# Patient Record
Sex: Female | Born: 1940
Health system: Southern US, Community
[De-identification: ages and names within clinical notes are randomized; demographics above are authoritative.]

## PROBLEM LIST (undated history)

## (undated) DIAGNOSIS — G5793 Unspecified mononeuropathy of bilateral lower limbs: Secondary | ICD-10-CM

## (undated) DIAGNOSIS — F419 Anxiety disorder, unspecified: Secondary | ICD-10-CM

## (undated) HISTORY — PX: BUNIONECTOMY: SHX129

## (undated) HISTORY — PX: BREAST EXCISIONAL BIOPSY: SUR124

## (undated) HISTORY — PX: BREAST LUMPECTOMY: SHX2

## (undated) HISTORY — DX: Unspecified mononeuropathy of bilateral lower limbs: G57.93

## (undated) HISTORY — DX: Anxiety disorder, unspecified: F41.9

---

## 1970-11-09 HISTORY — PX: ABDOMINAL HYSTERECTOMY: SUR658

## 1993-11-09 HISTORY — PX: OOPHORECTOMY: SHX86

## 1998-11-09 HISTORY — PX: GALLBLADDER SURGERY: SHX652

## 2001-10-09 HISTORY — PX: CATARACT EXTRACTION: SUR2

## 2001-11-09 HISTORY — PX: CATARACT EXTRACTION: SUR2

## 2005-06-09 HISTORY — PX: TOTAL HIP ARTHROPLASTY: SHX124

## 2007-12-11 HISTORY — PX: ANKLE RECONSTRUCTION: SHX1151

## 2008-11-09 HISTORY — PX: TOE SURGERY: SHX1073

## 2008-11-13 HISTORY — PX: TOTAL HIP ARTHROPLASTY: SHX124

## 2011-03-04 HISTORY — PX: OTHER SURGICAL HISTORY: SHX169

## 2011-04-22 HISTORY — PX: OTHER SURGICAL HISTORY: SHX169

## 2011-12-08 HISTORY — PX: REPLACEMENT TOTAL KNEE: SUR1224

## 2014-07-18 HISTORY — PX: LIPOMA EXCISION: SHX5283

## 2015-03-10 DIAGNOSIS — G629 Polyneuropathy, unspecified: Secondary | ICD-10-CM | POA: Insufficient documentation

## 2015-11-12 DIAGNOSIS — G629 Polyneuropathy, unspecified: Secondary | ICD-10-CM | POA: Diagnosis not present

## 2016-01-14 DIAGNOSIS — E538 Deficiency of other specified B group vitamins: Secondary | ICD-10-CM | POA: Diagnosis not present

## 2016-01-14 DIAGNOSIS — I10 Essential (primary) hypertension: Secondary | ICD-10-CM | POA: Diagnosis not present

## 2016-01-14 DIAGNOSIS — M79604 Pain in right leg: Secondary | ICD-10-CM | POA: Diagnosis not present

## 2016-01-14 DIAGNOSIS — F411 Generalized anxiety disorder: Secondary | ICD-10-CM | POA: Diagnosis not present

## 2016-01-14 DIAGNOSIS — R0989 Other specified symptoms and signs involving the circulatory and respiratory systems: Secondary | ICD-10-CM | POA: Diagnosis not present

## 2016-01-14 DIAGNOSIS — G6289 Other specified polyneuropathies: Secondary | ICD-10-CM | POA: Diagnosis not present

## 2016-01-14 DIAGNOSIS — E7800 Pure hypercholesterolemia, unspecified: Secondary | ICD-10-CM | POA: Diagnosis not present

## 2016-01-14 DIAGNOSIS — G8929 Other chronic pain: Secondary | ICD-10-CM | POA: Diagnosis not present

## 2016-01-14 DIAGNOSIS — R7303 Prediabetes: Secondary | ICD-10-CM | POA: Diagnosis not present

## 2016-01-14 DIAGNOSIS — M79605 Pain in left leg: Secondary | ICD-10-CM | POA: Diagnosis not present

## 2016-01-15 DIAGNOSIS — R7303 Prediabetes: Secondary | ICD-10-CM | POA: Diagnosis not present

## 2016-01-15 DIAGNOSIS — I10 Essential (primary) hypertension: Secondary | ICD-10-CM | POA: Diagnosis not present

## 2016-01-15 DIAGNOSIS — E7800 Pure hypercholesterolemia, unspecified: Secondary | ICD-10-CM | POA: Diagnosis not present

## 2016-01-15 DIAGNOSIS — E538 Deficiency of other specified B group vitamins: Secondary | ICD-10-CM | POA: Diagnosis not present

## 2016-01-24 DIAGNOSIS — R300 Dysuria: Secondary | ICD-10-CM | POA: Diagnosis not present

## 2016-01-24 DIAGNOSIS — I6523 Occlusion and stenosis of bilateral carotid arteries: Secondary | ICD-10-CM | POA: Diagnosis not present

## 2016-01-24 DIAGNOSIS — R0989 Other specified symptoms and signs involving the circulatory and respiratory systems: Secondary | ICD-10-CM | POA: Diagnosis not present

## 2016-02-04 DIAGNOSIS — M79609 Pain in unspecified limb: Secondary | ICD-10-CM | POA: Diagnosis not present

## 2016-02-04 DIAGNOSIS — E785 Hyperlipidemia, unspecified: Secondary | ICD-10-CM | POA: Diagnosis not present

## 2016-02-04 DIAGNOSIS — I70213 Atherosclerosis of native arteries of extremities with intermittent claudication, bilateral legs: Secondary | ICD-10-CM | POA: Diagnosis not present

## 2016-02-05 DIAGNOSIS — I70213 Atherosclerosis of native arteries of extremities with intermittent claudication, bilateral legs: Secondary | ICD-10-CM | POA: Diagnosis not present

## 2016-02-05 DIAGNOSIS — M79609 Pain in unspecified limb: Secondary | ICD-10-CM | POA: Diagnosis not present

## 2016-02-05 DIAGNOSIS — I739 Peripheral vascular disease, unspecified: Secondary | ICD-10-CM | POA: Diagnosis not present

## 2016-02-05 DIAGNOSIS — E785 Hyperlipidemia, unspecified: Secondary | ICD-10-CM | POA: Diagnosis not present

## 2016-04-03 DIAGNOSIS — Z96641 Presence of right artificial hip joint: Secondary | ICD-10-CM | POA: Diagnosis not present

## 2016-04-03 DIAGNOSIS — M47816 Spondylosis without myelopathy or radiculopathy, lumbar region: Secondary | ICD-10-CM | POA: Diagnosis not present

## 2016-04-03 DIAGNOSIS — Z96649 Presence of unspecified artificial hip joint: Secondary | ICD-10-CM | POA: Insufficient documentation

## 2016-04-03 DIAGNOSIS — M545 Low back pain: Secondary | ICD-10-CM | POA: Diagnosis not present

## 2016-04-03 DIAGNOSIS — M25551 Pain in right hip: Secondary | ICD-10-CM | POA: Diagnosis not present

## 2016-04-03 DIAGNOSIS — Z96642 Presence of left artificial hip joint: Secondary | ICD-10-CM | POA: Diagnosis not present

## 2016-04-03 DIAGNOSIS — M25552 Pain in left hip: Secondary | ICD-10-CM | POA: Diagnosis not present

## 2016-04-13 DIAGNOSIS — H04123 Dry eye syndrome of bilateral lacrimal glands: Secondary | ICD-10-CM | POA: Diagnosis not present

## 2016-04-14 DIAGNOSIS — M25552 Pain in left hip: Secondary | ICD-10-CM | POA: Diagnosis not present

## 2016-04-21 DIAGNOSIS — M25552 Pain in left hip: Secondary | ICD-10-CM | POA: Diagnosis not present

## 2016-04-22 DIAGNOSIS — I10 Essential (primary) hypertension: Secondary | ICD-10-CM | POA: Diagnosis not present

## 2016-04-22 DIAGNOSIS — R7303 Prediabetes: Secondary | ICD-10-CM | POA: Diagnosis not present

## 2016-04-22 DIAGNOSIS — E7800 Pure hypercholesterolemia, unspecified: Secondary | ICD-10-CM | POA: Diagnosis not present

## 2016-04-22 DIAGNOSIS — R232 Flushing: Secondary | ICD-10-CM | POA: Diagnosis not present

## 2016-04-22 DIAGNOSIS — N898 Other specified noninflammatory disorders of vagina: Secondary | ICD-10-CM | POA: Diagnosis not present

## 2016-04-22 DIAGNOSIS — F411 Generalized anxiety disorder: Secondary | ICD-10-CM | POA: Diagnosis not present

## 2016-04-23 DIAGNOSIS — M25552 Pain in left hip: Secondary | ICD-10-CM | POA: Diagnosis not present

## 2016-04-27 DIAGNOSIS — M25552 Pain in left hip: Secondary | ICD-10-CM | POA: Diagnosis not present

## 2016-04-30 DIAGNOSIS — M25552 Pain in left hip: Secondary | ICD-10-CM | POA: Diagnosis not present

## 2016-05-05 DIAGNOSIS — M25552 Pain in left hip: Secondary | ICD-10-CM | POA: Diagnosis not present

## 2016-05-07 DIAGNOSIS — M25552 Pain in left hip: Secondary | ICD-10-CM | POA: Diagnosis not present

## 2016-05-14 DIAGNOSIS — M25552 Pain in left hip: Secondary | ICD-10-CM | POA: Diagnosis not present

## 2016-05-19 DIAGNOSIS — M25552 Pain in left hip: Secondary | ICD-10-CM | POA: Diagnosis not present

## 2016-06-29 DIAGNOSIS — M25552 Pain in left hip: Secondary | ICD-10-CM | POA: Diagnosis not present

## 2016-08-04 DIAGNOSIS — M79604 Pain in right leg: Secondary | ICD-10-CM | POA: Diagnosis not present

## 2016-08-04 DIAGNOSIS — M79605 Pain in left leg: Secondary | ICD-10-CM | POA: Diagnosis not present

## 2016-08-04 DIAGNOSIS — R600 Localized edema: Secondary | ICD-10-CM | POA: Diagnosis not present

## 2016-08-04 DIAGNOSIS — G2581 Restless legs syndrome: Secondary | ICD-10-CM | POA: Diagnosis not present

## 2016-08-07 ENCOUNTER — Other Ambulatory Visit: Payer: Self-pay | Admitting: Family Medicine

## 2016-08-07 DIAGNOSIS — Z1231 Encounter for screening mammogram for malignant neoplasm of breast: Secondary | ICD-10-CM

## 2016-08-07 DIAGNOSIS — M545 Low back pain: Secondary | ICD-10-CM | POA: Diagnosis not present

## 2016-08-12 DIAGNOSIS — G2581 Restless legs syndrome: Secondary | ICD-10-CM | POA: Diagnosis not present

## 2016-08-12 DIAGNOSIS — M79605 Pain in left leg: Secondary | ICD-10-CM | POA: Diagnosis not present

## 2016-08-12 DIAGNOSIS — M79604 Pain in right leg: Secondary | ICD-10-CM | POA: Diagnosis not present

## 2016-08-12 DIAGNOSIS — R600 Localized edema: Secondary | ICD-10-CM | POA: Diagnosis not present

## 2016-08-17 DIAGNOSIS — F418 Other specified anxiety disorders: Secondary | ICD-10-CM | POA: Diagnosis not present

## 2016-08-24 DIAGNOSIS — M79605 Pain in left leg: Secondary | ICD-10-CM | POA: Diagnosis not present

## 2016-08-24 DIAGNOSIS — M79604 Pain in right leg: Secondary | ICD-10-CM | POA: Diagnosis not present

## 2016-08-25 ENCOUNTER — Ambulatory Visit
Admission: RE | Admit: 2016-08-25 | Discharge: 2016-08-25 | Disposition: A | Discharge status: Home / Self Care | Payer: Medicare Other | Source: Ambulatory Visit | Attending: Family Medicine | Admitting: Family Medicine

## 2016-08-25 DIAGNOSIS — Z1231 Encounter for screening mammogram for malignant neoplasm of breast: Secondary | ICD-10-CM | POA: Diagnosis not present

## 2016-09-08 DIAGNOSIS — J3089 Other allergic rhinitis: Secondary | ICD-10-CM | POA: Diagnosis not present

## 2016-09-08 DIAGNOSIS — G629 Polyneuropathy, unspecified: Secondary | ICD-10-CM | POA: Diagnosis not present

## 2016-09-08 DIAGNOSIS — E784 Other hyperlipidemia: Secondary | ICD-10-CM | POA: Diagnosis not present

## 2016-09-08 DIAGNOSIS — M79605 Pain in left leg: Secondary | ICD-10-CM | POA: Diagnosis not present

## 2016-09-08 DIAGNOSIS — R911 Solitary pulmonary nodule: Secondary | ICD-10-CM | POA: Diagnosis not present

## 2016-09-08 DIAGNOSIS — Z79899 Other long term (current) drug therapy: Secondary | ICD-10-CM | POA: Diagnosis not present

## 2016-09-08 DIAGNOSIS — F419 Anxiety disorder, unspecified: Secondary | ICD-10-CM | POA: Diagnosis not present

## 2016-09-08 DIAGNOSIS — M79604 Pain in right leg: Secondary | ICD-10-CM | POA: Diagnosis not present

## 2016-09-08 DIAGNOSIS — I10 Essential (primary) hypertension: Secondary | ICD-10-CM | POA: Diagnosis not present

## 2016-09-25 DIAGNOSIS — M79605 Pain in left leg: Secondary | ICD-10-CM | POA: Diagnosis not present

## 2016-09-25 DIAGNOSIS — E784 Other hyperlipidemia: Secondary | ICD-10-CM | POA: Diagnosis not present

## 2016-09-25 DIAGNOSIS — M79604 Pain in right leg: Secondary | ICD-10-CM | POA: Diagnosis not present

## 2016-10-08 ENCOUNTER — Encounter: Payer: Self-pay | Admitting: *Deleted

## 2016-10-09 ENCOUNTER — Encounter: Payer: Self-pay | Admitting: Pulmonary Disease

## 2016-10-09 ENCOUNTER — Ambulatory Visit (INDEPENDENT_AMBULATORY_CARE_PROVIDER_SITE_OTHER): Payer: Medicare Other | Admitting: Pulmonary Disease

## 2016-10-09 VITALS — BP 132/66 | HR 74 | Ht 65.0 in | Wt 171.4 lb

## 2016-10-09 DIAGNOSIS — R911 Solitary pulmonary nodule: Secondary | ICD-10-CM | POA: Diagnosis not present

## 2016-10-09 NOTE — Patient Instructions (Addendum)
Stop taking the Spiriva. We'll start you on Bevespi inhaler. We will give you an albuterol rescue inhaler We'll schedule you for a CT of the chest without contrast in April 2018. Return to clinic after CT scan with PFTs.

## 2016-10-09 NOTE — Progress Notes (Signed)
Deborah Waller    RZ:[id number]    Dec 21, 1940  Primary Care Physician:Jennifer Owens Shark, NP  Referring Physician: Eloise Levels, NP [phone], [place] [address]  Chief complaint:  Consult to establish pulmonary care.  HPI: [name] is a 75 year old with past medical history of pulmonary nodules, mild COPD. She was previously followed by Dr. Barnet Pall, pulmonologist at [place], [place]. She moved here earlier this year to be closer to family. She is being followed for 3 subcentimeter pulmonary nodules with calcification suggestive of granulomas. She also has mild obstruction on her PFTs (I don't have the full study to review] and is maintained on Spiriva. She reports that the Spiriva is not working as well anymore. She reports worsening symptoms of dyspnea during the summer months when she is is out doors for golfing. She also reports chronic dyspnea on exertion on a daily basis that has been gradually worsening. She was previously on albuterol rescue inhaler but does not using it anymore. She denies any cough, sputum production, dyspnea at rest, wheezing, fevers, chills.  She is a 33 pack year smoking history. Quit in 1989. She was evaluated by sleep study in 2016. This was negative as per her pulmonologist records.  Outpatient Encounter Prescriptions as of 10/09/2016  Medication Sig  . aspirin EC 81 MG tablet Take 81 mg by mouth daily.  . celecoxib (CELEBREX) 200 MG capsule Take 200 mg by mouth daily.  . cholecalciferol (VITAMIN D) 400 units TABS tablet Take 400 Units by mouth.  . escitalopram (LEXAPRO) 10 MG tablet Take 10 mg by mouth daily.  . hydrochlorothiazide (HYDRODIURIL) 25 MG tablet Take 25 mg by mouth daily.  . pregabalin (LYRICA) 150 MG capsule Take 150 mg by mouth 2 (two) times daily.  . rosuvastatin (CRESTOR) 5 MG tablet Take 5 mg by mouth daily.  . Tiotropium Bromide Monohydrate (SPIRIVA RESPIMAT) 1.25 MCG/ACT AERS Inhale into the lungs.  . vitamin B-12  (CYANOCOBALAMIN) 1000 MCG tablet Take 1,000 mcg by mouth daily.   No facility-administered encounter medications on file as of 10/09/2016.     Allergies as of 10/09/2016 - Review Complete 10/09/2016  Allergen Reaction Noted  . Oxycodone  10/09/2016    Past Medical History:  Diagnosis Date  . Anxiety   . Neuropathy of both feet     Past Surgical History:  Procedure Laterality Date  . ABDOMINAL HYSTERECTOMY  1972  . ANKLE RECONSTRUCTION Left 12/2007  . BREAST LUMPECTOMY Left   . BUNIONECTOMY Left 2002, 2003  . CATARACT EXTRACTION Left 11/2001  . CATARACT EXTRACTION Right 10/2001  . GALLBLADDER SURGERY  2000  . leg bypass- left  03/04/2011  . leg bypass-right   04/22/2011  . LIPOMA EXCISION  07/18/2014  . OOPHORECTOMY  1995  . REPLACEMENT TOTAL KNEE Right 12/08/2011  . TOE SURGERY Left 2010  . TOTAL HIP ARTHROPLASTY Right 11/13/2008  . TOTAL HIP ARTHROPLASTY Left 06/2005    Family History  Problem Relation Age of Onset  . Allergies Sister     Social History   Social History  . Marital status: Married    Spouse name: N/A  . Number of children: N/A  . Years of education: N/A   Occupational History  . Not on file.   Social History Main Topics  . Smoking status: Former Smoker    Packs/day: 1.00    Years: 33.00    Types: Cigarettes    Quit date: 11/10/1987  . Smokeless tobacco:  Never Used  . Alcohol use No  . Drug use: No  . Sexual activity: Not on file   Other Topics Concern  . Not on file   Social History Narrative   Married, lives with husband, has one child, and is retired.      Review of systems: Review of Systems  Constitutional: Negative for fever and chills.  HENT: Negative.   Eyes: Negative for blurred vision.  Respiratory: as per HPI  Cardiovascular: Negative for chest pain and palpitations.  Gastrointestinal: Negative for vomiting, diarrhea, blood per rectum. Genitourinary: Negative for dysuria, urgency, frequency and hematuria.    Musculoskeletal: Negative for myalgias, back pain and joint pain.  Skin: Negative for itching and rash.  Neurological: Negative for dizziness, tremors, focal weakness, seizures and loss of consciousness.  Endo/Heme/Allergies: Negative for environmental allergies.  Psychiatric/Behavioral: Negative for depression, suicidal ideas and hallucinations.  All other systems reviewed and are negative.   Physical Exam: Blood pressure 132/66, pulse 74, height 5\' 5"  (1.651 m), weight 171 lb 6.4 oz (77.7 kg), SpO2 94 %. Gen:      No acute distress HEENT:  EOMI, sclera anicteric Neck:     No masses; no thyromegaly Lungs:    Clear to auscultation bilaterally; normal respiratory effort CV:         Regular rate and rhythm; no murmurs Abd:      + bowel sounds; soft, non-tender; no palpable masses, no distension Ext:    No edema; adequate peripheral perfusion Skin:      Warm and dry; no rash Neuro: alert and oriented x 3 Psych: normal mood and affect  Data Reviewed: Data from previous pulmonologist reviewed including images (except CT scan from 2015)  CT scan  07/30/14-. Small lung nodules CT scan 02/07/15- Small pulmonary nodule stable. CT scan 07/18/15-3 to 4 mm pulmonary nodule in the right upper lobe. 2-3 mm calcified nodule in the right lower lobe. 2-3 mm calcified granuloma in the left lower lobe. Subsegmental atelectasis in the right lower lobe. Diffuse emphysematous changes.  PFT 2016 -mild airflow obstruction, moderate diffusion abnormality as per pulmonologist note. Actual study not sent.  Assessment:  Mild COPD Worsening symptoms on Spiriva. I'll schedule for a repeat pulmonary function tests. We'll stop Spiriva and start a LABA/LAMA combination with Bevespi. She'll also be started on albuterol when necessary to be used in the summer months.  Pulmonary nodules Stable from 2015- 2016 with appearance suggestive of calcified granulomas. These are probably benign in nature. I'll repeat a CT of  the chest in around March 2018. This will coincide with two-year follow-up from March 2016 scan which is the earliest scan I have images to compare with. If stable we can stop following up with further CT scans.  Plan/Recommendations: - Stop Spiriva - Start Bevespi, Albuterol PRN - CT scan follow up  Marshell Garfinkel MD [facility] Pager [phone] 10/09/2016, 11:25 AM  CC: Deborah Levels, NP

## 2016-10-13 MED ORDER — GLYCOPYRROLATE-FORMOTEROL 9-4.8 MCG/ACT IN AERO: 2.0000 | INHALATION_SPRAY | Freq: Two times a day (BID) | RESPIRATORY_TRACT | 0 refills | Status: DC

## 2016-10-14 ENCOUNTER — Other Ambulatory Visit: Payer: Self-pay

## 2016-10-14 ENCOUNTER — Telehealth: Payer: Self-pay | Admitting: Pulmonary Disease

## 2016-10-14 MED ORDER — GLYCOPYRROLATE-FORMOTEROL 9-4.8 MCG/ACT IN AERO: 2.0000 | INHALATION_SPRAY | Freq: Two times a day (BID) | RESPIRATORY_TRACT | 1 refills | Status: DC

## 2016-10-14 MED ORDER — ALBUTEROL SULFATE HFA 108 (90 BASE) MCG/ACT IN AERS: 2.0000 | INHALATION_SPRAY | Freq: Four times a day (QID) | RESPIRATORY_TRACT | 1 refills | Status: DC | PRN

## 2016-10-14 NOTE — Telephone Encounter (Signed)
Patient returning phone call. Please call patient [phone]

## 2016-10-14 NOTE — Telephone Encounter (Signed)
[id number] x1 

## 2016-10-14 NOTE — Telephone Encounter (Signed)
Plan/Recommendations: - Stop Spiriva - Start Bevespi, Albuterol PRN - CT scan follow up  Marshell Garfinkel MD [facility] Pager [phone] 10/09/2016, 11:25 AM  Pt states was told Bevespi and Albuterol HFA would be sent at [place] for 90 day supply to [facility]-she contacted [facility] and was told they do not have any Rx's on file for her.    Pt is aware that I have sent Rx's and apologized for any inconvenience this may have caused.

## 2016-10-21 DIAGNOSIS — M7062 Trochanteric bursitis, left hip: Secondary | ICD-10-CM | POA: Diagnosis not present

## 2016-10-26 DIAGNOSIS — M25552 Pain in left hip: Secondary | ICD-10-CM | POA: Diagnosis not present

## 2016-10-26 DIAGNOSIS — M7632 Iliotibial band syndrome, left leg: Secondary | ICD-10-CM | POA: Diagnosis not present

## 2016-10-26 DIAGNOSIS — M25652 Stiffness of left hip, not elsewhere classified: Secondary | ICD-10-CM | POA: Diagnosis not present

## 2016-11-04 DIAGNOSIS — M25652 Stiffness of left hip, not elsewhere classified: Secondary | ICD-10-CM | POA: Diagnosis not present

## 2016-11-04 DIAGNOSIS — M25552 Pain in left hip: Secondary | ICD-10-CM | POA: Diagnosis not present

## 2016-11-04 DIAGNOSIS — M7632 Iliotibial band syndrome, left leg: Secondary | ICD-10-CM | POA: Diagnosis not present

## 2016-11-05 DIAGNOSIS — G5761 Lesion of plantar nerve, right lower limb: Secondary | ICD-10-CM | POA: Diagnosis not present

## 2016-11-05 DIAGNOSIS — M7751 Other enthesopathy of right foot: Secondary | ICD-10-CM | POA: Diagnosis not present

## 2016-11-05 DIAGNOSIS — M19071 Primary osteoarthritis, right ankle and foot: Secondary | ICD-10-CM | POA: Diagnosis not present

## 2016-11-05 DIAGNOSIS — G5791 Unspecified mononeuropathy of right lower limb: Secondary | ICD-10-CM | POA: Diagnosis not present

## 2016-11-12 DIAGNOSIS — G5761 Lesion of plantar nerve, right lower limb: Secondary | ICD-10-CM | POA: Diagnosis not present

## 2016-11-12 DIAGNOSIS — M19072 Primary osteoarthritis, left ankle and foot: Secondary | ICD-10-CM | POA: Diagnosis not present

## 2016-11-12 DIAGNOSIS — M659 Synovitis and tenosynovitis, unspecified: Secondary | ICD-10-CM | POA: Diagnosis not present

## 2016-11-17 DIAGNOSIS — M25652 Stiffness of left hip, not elsewhere classified: Secondary | ICD-10-CM | POA: Diagnosis not present

## 2016-11-17 DIAGNOSIS — M25552 Pain in left hip: Secondary | ICD-10-CM | POA: Diagnosis not present

## 2016-11-17 DIAGNOSIS — M7632 Iliotibial band syndrome, left leg: Secondary | ICD-10-CM | POA: Diagnosis not present

## 2016-11-20 DIAGNOSIS — M25652 Stiffness of left hip, not elsewhere classified: Secondary | ICD-10-CM | POA: Diagnosis not present

## 2016-11-20 DIAGNOSIS — M7632 Iliotibial band syndrome, left leg: Secondary | ICD-10-CM | POA: Diagnosis not present

## 2016-11-20 DIAGNOSIS — M25552 Pain in left hip: Secondary | ICD-10-CM | POA: Diagnosis not present

## 2016-11-23 DIAGNOSIS — G5761 Lesion of plantar nerve, right lower limb: Secondary | ICD-10-CM | POA: Diagnosis not present

## 2016-11-23 DIAGNOSIS — G5762 Lesion of plantar nerve, left lower limb: Secondary | ICD-10-CM | POA: Diagnosis not present

## 2016-11-23 DIAGNOSIS — M659 Synovitis and tenosynovitis, unspecified: Secondary | ICD-10-CM | POA: Diagnosis not present

## 2016-11-23 DIAGNOSIS — M7751 Other enthesopathy of right foot: Secondary | ICD-10-CM | POA: Diagnosis not present

## 2016-11-23 DIAGNOSIS — M7752 Other enthesopathy of left foot: Secondary | ICD-10-CM | POA: Diagnosis not present

## 2016-11-24 DIAGNOSIS — M25652 Stiffness of left hip, not elsewhere classified: Secondary | ICD-10-CM | POA: Diagnosis not present

## 2016-11-24 DIAGNOSIS — M25552 Pain in left hip: Secondary | ICD-10-CM | POA: Diagnosis not present

## 2016-11-24 DIAGNOSIS — M7632 Iliotibial band syndrome, left leg: Secondary | ICD-10-CM | POA: Diagnosis not present

## 2016-11-30 DIAGNOSIS — G5761 Lesion of plantar nerve, right lower limb: Secondary | ICD-10-CM | POA: Diagnosis not present

## 2016-11-30 DIAGNOSIS — M7752 Other enthesopathy of left foot: Secondary | ICD-10-CM | POA: Diagnosis not present

## 2016-11-30 DIAGNOSIS — G5762 Lesion of plantar nerve, left lower limb: Secondary | ICD-10-CM | POA: Diagnosis not present

## 2016-11-30 DIAGNOSIS — M7751 Other enthesopathy of right foot: Secondary | ICD-10-CM | POA: Diagnosis not present

## 2016-11-30 DIAGNOSIS — T148XXA Other injury of unspecified body region, initial encounter: Secondary | ICD-10-CM | POA: Diagnosis not present

## 2016-12-02 DIAGNOSIS — M7632 Iliotibial band syndrome, left leg: Secondary | ICD-10-CM | POA: Diagnosis not present

## 2016-12-02 DIAGNOSIS — M25552 Pain in left hip: Secondary | ICD-10-CM | POA: Diagnosis not present

## 2016-12-02 DIAGNOSIS — M25652 Stiffness of left hip, not elsewhere classified: Secondary | ICD-10-CM | POA: Diagnosis not present

## 2016-12-03 DIAGNOSIS — M25652 Stiffness of left hip, not elsewhere classified: Secondary | ICD-10-CM | POA: Diagnosis not present

## 2016-12-03 DIAGNOSIS — M7632 Iliotibial band syndrome, left leg: Secondary | ICD-10-CM | POA: Diagnosis not present

## 2016-12-03 DIAGNOSIS — M25552 Pain in left hip: Secondary | ICD-10-CM | POA: Diagnosis not present

## 2016-12-07 DIAGNOSIS — M7752 Other enthesopathy of left foot: Secondary | ICD-10-CM | POA: Diagnosis not present

## 2016-12-07 DIAGNOSIS — T148XXA Other injury of unspecified body region, initial encounter: Secondary | ICD-10-CM | POA: Diagnosis not present

## 2016-12-07 DIAGNOSIS — G5762 Lesion of plantar nerve, left lower limb: Secondary | ICD-10-CM | POA: Diagnosis not present

## 2016-12-07 DIAGNOSIS — M7751 Other enthesopathy of right foot: Secondary | ICD-10-CM | POA: Diagnosis not present

## 2016-12-07 DIAGNOSIS — G5761 Lesion of plantar nerve, right lower limb: Secondary | ICD-10-CM | POA: Diagnosis not present

## 2016-12-08 DIAGNOSIS — M7632 Iliotibial band syndrome, left leg: Secondary | ICD-10-CM | POA: Diagnosis not present

## 2016-12-08 DIAGNOSIS — M25552 Pain in left hip: Secondary | ICD-10-CM | POA: Diagnosis not present

## 2016-12-08 DIAGNOSIS — M25652 Stiffness of left hip, not elsewhere classified: Secondary | ICD-10-CM | POA: Diagnosis not present

## 2016-12-10 DIAGNOSIS — M25652 Stiffness of left hip, not elsewhere classified: Secondary | ICD-10-CM | POA: Diagnosis not present

## 2016-12-10 DIAGNOSIS — M7632 Iliotibial band syndrome, left leg: Secondary | ICD-10-CM | POA: Diagnosis not present

## 2016-12-10 DIAGNOSIS — M25552 Pain in left hip: Secondary | ICD-10-CM | POA: Diagnosis not present

## 2016-12-16 DIAGNOSIS — M25552 Pain in left hip: Secondary | ICD-10-CM | POA: Diagnosis not present

## 2016-12-16 DIAGNOSIS — M7632 Iliotibial band syndrome, left leg: Secondary | ICD-10-CM | POA: Diagnosis not present

## 2016-12-16 DIAGNOSIS — M25652 Stiffness of left hip, not elsewhere classified: Secondary | ICD-10-CM | POA: Diagnosis not present

## 2016-12-31 DIAGNOSIS — G5762 Lesion of plantar nerve, left lower limb: Secondary | ICD-10-CM | POA: Diagnosis not present

## 2016-12-31 DIAGNOSIS — M7751 Other enthesopathy of right foot: Secondary | ICD-10-CM | POA: Diagnosis not present

## 2016-12-31 DIAGNOSIS — G5761 Lesion of plantar nerve, right lower limb: Secondary | ICD-10-CM | POA: Diagnosis not present

## 2016-12-31 DIAGNOSIS — M7752 Other enthesopathy of left foot: Secondary | ICD-10-CM | POA: Diagnosis not present

## 2017-01-04 DIAGNOSIS — E538 Deficiency of other specified B group vitamins: Secondary | ICD-10-CM | POA: Diagnosis not present

## 2017-01-04 DIAGNOSIS — I739 Peripheral vascular disease, unspecified: Secondary | ICD-10-CM | POA: Diagnosis not present

## 2017-01-04 DIAGNOSIS — E784 Other hyperlipidemia: Secondary | ICD-10-CM | POA: Diagnosis not present

## 2017-01-04 DIAGNOSIS — Z79899 Other long term (current) drug therapy: Secondary | ICD-10-CM | POA: Diagnosis not present

## 2017-01-04 DIAGNOSIS — R7303 Prediabetes: Secondary | ICD-10-CM | POA: Diagnosis not present

## 2017-01-04 DIAGNOSIS — F419 Anxiety disorder, unspecified: Secondary | ICD-10-CM | POA: Diagnosis not present

## 2017-01-04 DIAGNOSIS — G629 Polyneuropathy, unspecified: Secondary | ICD-10-CM | POA: Diagnosis not present

## 2017-01-04 DIAGNOSIS — I10 Essential (primary) hypertension: Secondary | ICD-10-CM | POA: Diagnosis not present

## 2017-01-04 DIAGNOSIS — M8580 Other specified disorders of bone density and structure, unspecified site: Secondary | ICD-10-CM | POA: Diagnosis not present

## 2017-01-07 DIAGNOSIS — G5761 Lesion of plantar nerve, right lower limb: Secondary | ICD-10-CM | POA: Diagnosis not present

## 2017-01-07 DIAGNOSIS — M19072 Primary osteoarthritis, left ankle and foot: Secondary | ICD-10-CM | POA: Diagnosis not present

## 2017-01-07 DIAGNOSIS — M7751 Other enthesopathy of right foot: Secondary | ICD-10-CM | POA: Diagnosis not present

## 2017-01-07 DIAGNOSIS — M7752 Other enthesopathy of left foot: Secondary | ICD-10-CM | POA: Diagnosis not present

## 2017-01-07 DIAGNOSIS — G5762 Lesion of plantar nerve, left lower limb: Secondary | ICD-10-CM | POA: Diagnosis not present

## 2017-01-19 ENCOUNTER — Telehealth: Payer: Self-pay | Admitting: Pulmonary Disease

## 2017-01-19 NOTE — Telephone Encounter (Signed)
Spoke with pt, calling to see if she was due for a ct chest.  I advised pt that she is scheduled for one on 02/15/17 at 2:00 at [facility] CT.  Pt expressed understanding, took down address, date, and time.  Nothing further needed.

## 2017-02-04 DIAGNOSIS — M722 Plantar fascial fibromatosis: Secondary | ICD-10-CM | POA: Diagnosis not present

## 2017-02-04 DIAGNOSIS — L600 Ingrowing nail: Secondary | ICD-10-CM | POA: Diagnosis not present

## 2017-02-04 DIAGNOSIS — M7732 Calcaneal spur, left foot: Secondary | ICD-10-CM | POA: Diagnosis not present

## 2017-02-04 DIAGNOSIS — M71572 Other bursitis, not elsewhere classified, left ankle and foot: Secondary | ICD-10-CM | POA: Diagnosis not present

## 2017-02-08 ENCOUNTER — Other Ambulatory Visit (INDEPENDENT_AMBULATORY_CARE_PROVIDER_SITE_OTHER): Payer: Self-pay | Admitting: Vascular Surgery

## 2017-02-08 DIAGNOSIS — I739 Peripheral vascular disease, unspecified: Secondary | ICD-10-CM

## 2017-02-08 DIAGNOSIS — M71572 Other bursitis, not elsewhere classified, left ankle and foot: Secondary | ICD-10-CM | POA: Diagnosis not present

## 2017-02-08 DIAGNOSIS — Z95828 Presence of other vascular implants and grafts: Secondary | ICD-10-CM

## 2017-02-08 DIAGNOSIS — E119 Type 2 diabetes mellitus without complications: Secondary | ICD-10-CM | POA: Diagnosis not present

## 2017-02-08 DIAGNOSIS — M722 Plantar fascial fibromatosis: Secondary | ICD-10-CM | POA: Diagnosis not present

## 2017-02-08 DIAGNOSIS — Z7984 Long term (current) use of oral hypoglycemic drugs: Secondary | ICD-10-CM | POA: Diagnosis not present

## 2017-02-08 DIAGNOSIS — M659 Synovitis and tenosynovitis, unspecified: Secondary | ICD-10-CM | POA: Diagnosis not present

## 2017-02-08 DIAGNOSIS — L600 Ingrowing nail: Secondary | ICD-10-CM | POA: Diagnosis not present

## 2017-02-09 ENCOUNTER — Encounter (INDEPENDENT_AMBULATORY_CARE_PROVIDER_SITE_OTHER): Payer: Self-pay

## 2017-02-09 ENCOUNTER — Ambulatory Visit (INDEPENDENT_AMBULATORY_CARE_PROVIDER_SITE_OTHER): Payer: Medicare Other

## 2017-02-09 ENCOUNTER — Encounter (INDEPENDENT_AMBULATORY_CARE_PROVIDER_SITE_OTHER): Payer: Self-pay | Admitting: Vascular Surgery

## 2017-02-09 ENCOUNTER — Ambulatory Visit (INDEPENDENT_AMBULATORY_CARE_PROVIDER_SITE_OTHER): Payer: Medicare Other | Admitting: Vascular Surgery

## 2017-02-09 VITALS — BP 141/70 | HR 56 | Resp 16 | Wt 170.0 lb

## 2017-02-09 DIAGNOSIS — I739 Peripheral vascular disease, unspecified: Secondary | ICD-10-CM

## 2017-02-09 DIAGNOSIS — Z95828 Presence of other vascular implants and grafts: Secondary | ICD-10-CM | POA: Diagnosis not present

## 2017-02-09 DIAGNOSIS — E785 Hyperlipidemia, unspecified: Secondary | ICD-10-CM

## 2017-02-09 DIAGNOSIS — E119 Type 2 diabetes mellitus without complications: Secondary | ICD-10-CM | POA: Insufficient documentation

## 2017-02-09 DIAGNOSIS — E1151 Type 2 diabetes mellitus with diabetic peripheral angiopathy without gangrene: Secondary | ICD-10-CM | POA: Diagnosis not present

## 2017-02-09 LAB — VAS US LOWER EXTREMITY ARTERIAL DUPLEX
LEFT PERO DIST DIA: 0 cm/s
LEFT PERO DIST SYS: -84 cm/s
LEFT POPLITEAL DIST DYS VEL: 0 cm/s
Left ant tibial distal sys: 75 cm/s
Left popliteal dist sys PSV: 61 cm/s
RIGHT ANT DIST TIBAL DIA PSV: 0 cm/s
RIGHT ANT DIST TIBAL SYS PSV: 118 cm/s
RIGHT POPLITEAL DIST EDV: 0 cm/s
RIGHT POST TIB DIST DIA: 9 cm/s
RIGHT POST TIB DIST SYS: 104 cm/s
Right peroneal sys PSV: -55 cm/s
Right peroneal sys min: 0 m/s
Right popliteal dist sys PSV: 64 cm/s
left ant tibial distal dia: 0 cm/s
left post tibial dist dia: 0 cm/s
left post tibial dist sys: 48 cm/s

## 2017-02-09 NOTE — Assessment & Plan Note (Signed)
The patient is doing well without any current limb threatening symptoms. She had a recent surgery on her foot which should have ample blood flow to heal well based on her noninvasive studies today. These noninvasive studies showed normal triphasic waveforms and normal ABIs bilaterally. Her bypasses are patent bilaterally with only mildly elevated velocities proximal to the left femoral to popliteal bypass. This is stable from her previous study. Plan to recheck in 1 year with noninvasive studies unless worsening problems develop in the interim.

## 2017-02-09 NOTE — Assessment & Plan Note (Signed)
lipid control important in reducing the progression of atherosclerotic disease. Continue statin therapy  

## 2017-02-09 NOTE — Assessment & Plan Note (Signed)
blood glucose control important in reducing the progression of atherosclerotic disease. Also, involved in wound healing. On appropriate medications.  

## 2017-02-09 NOTE — Progress Notes (Signed)
MRN : [id number]  Deborah Waller is a 76 y.o. (1941/03/04) female who presents with chief complaint of  Chief Complaint  Patient presents with  . Re-evaluation    1 year ultrasound follow up  .  History of Present Illness: Patient returns today in follow up of PAD. She had a recent surgery on her foot which seems to be healing well. She has no new ischemic rest pain, ulceration, or infection. She follows up with noninvasive studies today. These noninvasive studies showed normal triphasic waveforms and normal ABIs bilaterally. Her bypasses are patent bilaterally with only mildly elevated velocities proximal to the left femoral to popliteal bypass. This is stable from her previous study.  Current Outpatient Prescriptions  Medication Sig Dispense Refill  . albuterol (PROVENTIL HFA;VENTOLIN HFA) 108 (90 Base) MCG/ACT inhaler Inhale 2 puffs into the lungs every 6 (six) hours as needed for wheezing or shortness of breath. 3 Inhaler 1  . aspirin EC 81 MG tablet Take 81 mg by mouth daily.    . cholecalciferol (VITAMIN D) 400 units TABS tablet Take 400 Units by mouth.    . escitalopram (LEXAPRO) 10 MG tablet Take 10 mg by mouth daily.    . Glycopyrrolate-Formoterol (BEVESPI AEROSPHERE) 9-4.8 MCG/ACT AERO Inhale 2 puffs into the lungs 2 (two) times daily. 3 Inhaler 1  . hydrocodone-ibuprofen (VICOPROFEN) 5-200 MG tablet Take 1 tablet by mouth every 8 (eight) hours as needed for pain.    . Ibuprofen-Famotidine 800-26.6 MG TABS Take by mouth 2 (two) times daily.    . pregabalin (LYRICA) 150 MG capsule Take 150 mg by mouth 2 (two) times daily.    . rosuvastatin (CRESTOR) 5 MG tablet Take 5 mg by mouth daily.    . vitamin B-12 (CYANOCOBALAMIN) 1000 MCG tablet Take 1,000 mcg by mouth daily.    . celecoxib (CELEBREX) 200 MG capsule Take 200 mg by mouth daily.    Marland Kitchen estradiol (ESTRACE) 0.1 MG/GM vaginal cream Place vaginally.    . hydrochlorothiazide (HYDRODIURIL) 25 MG tablet Take 25 mg by mouth daily.     Marland Kitchen LORazepam (ATIVAN) 2 MG tablet 1 tab 1 1/2 hours before the procedure    . metFORMIN (GLUCOPHAGE-XR) 500 MG 24 hr tablet     . simvastatin (ZOCOR) 20 MG tablet Take by mouth.    . Tiotropium Bromide Monohydrate (SPIRIVA RESPIMAT) 1.25 MCG/ACT AERS Inhale into the lungs.     No current facility-administered medications for this visit.     Past Medical History:  Diagnosis Date  . Anxiety   . Neuropathy of both feet     Past Surgical History:  Procedure Laterality Date  . ABDOMINAL HYSTERECTOMY  1972  . ANKLE RECONSTRUCTION Left 12/2007  . BREAST LUMPECTOMY Left   . BUNIONECTOMY Left 2002, 2003  . CATARACT EXTRACTION Left 11/2001  . CATARACT EXTRACTION Right 10/2001  . GALLBLADDER SURGERY  2000  . leg bypass- left  03/04/2011  . leg bypass-right   04/22/2011  . LIPOMA EXCISION  07/18/2014  . OOPHORECTOMY  1995  . REPLACEMENT TOTAL KNEE Right 12/08/2011  . TOE SURGERY Left 2010  . TOTAL HIP ARTHROPLASTY Right 11/13/2008  . TOTAL HIP ARTHROPLASTY Left 06/2005    Social History Social History  Substance Use Topics  . Smoking status: Former Smoker    Packs/day: 1.00    Years: 33.00    Types: Cigarettes    Quit date: 11/10/1987  . Smokeless tobacco: Never Used  . Alcohol use No  No IV  drug use  Family History Family History  Problem Relation Age of Onset  . Allergies Sister   No bleeding or clotting disorders  Allergies  Allergen Reactions  . Oxycodone     Childhood reaction, does not remember the reaction.      REVIEW OF SYSTEMS (Negative unless checked)  Constitutional: [] Weight loss  [] Fever  [] Chills Cardiac: [] Chest pain   [] Chest pressure   [] Palpitations   [] Shortness of breath when laying flat   [] Shortness of breath at rest   [] Shortness of breath with exertion. Vascular:  [x] Pain in legs with walking   [x] Pain in legs at rest   [] Pain in legs when laying flat   [] Claudication   [] Pain in feet when walking  [] Pain in feet at rest  [] Pain in feet when  laying flat   [] History of DVT   [] Phlebitis   [] Swelling in legs   [] Varicose veins   [] Non-healing ulcers Pulmonary:   [] Uses home oxygen   [] Productive cough   [] Hemoptysis   [] Wheeze  [] COPD   [] Asthma Neurologic:  [] Dizziness  [] Blackouts   [] Seizures   [] History of stroke   [] History of TIA  [] Aphasia   [] Temporary blindness   [] Dysphagia   [] Weakness or numbness in arms   [x] Weakness or numbness in legs Musculoskeletal:  [x] Arthritis   [] Joint swelling   [] Joint pain   [] Low back pain Hematologic:  [] Easy bruising  [] Easy bleeding   [] Hypercoagulable state   [] Anemic   Gastrointestinal:  [] Blood in stool   [] Vomiting blood  [] Gastroesophageal reflux/heartburn   [] Abdominal pain Genitourinary:  [] Chronic kidney disease   [] Difficult urination  [] Frequent urination  [] Burning with urination   [] Hematuria Skin:  [] Rashes   [] Ulcers   [] Wounds Psychological:  [] History of anxiety   []  History of major depression.  Physical Examination  BP (!) 141/70   Pulse (!) 56   Resp 16   Wt 170 lb (77.1 kg)   BMI 28.29 kg/m  Gen:  WD/WN, NAD Head: [place]/AT, No temporalis wasting. Ear/Nose/Throat: Hearing grossly intact, nares w/o erythema or drainage, trachea midline Eyes: Conjunctiva clear. Sclera non-icteric Neck: Supple.  No JVD.  Pulmonary:  Good air movement, no use of accessory muscles.  Cardiac: RRR, normal S1, S2 Vascular:  Vessel Right Left  Radial Palpable Palpable  Ulnar Palpable Palpable  Brachial Palpable Palpable  Carotid Palpable, without bruit Palpable, without bruit  Aorta Not palpable N/A  Femoral Palpable Palpable  Popliteal Palpable Palpable  PT Palpable Palpable  DP 1+ Palpable Palpable   Gastrointestinal: soft, non-tender/non-distended. No guarding/reflex.  Musculoskeletal: M/S 5/5 throughout.  No deformity or atrophy. No edema. Neurologic: Sensation grossly intact in extremities.  Symmetrical.  Speech is fluent.  Psychiatric: Judgment intact, Mood & affect  appropriate for pt's clinical situation. Dermatologic: No rashes or ulcers noted.  No cellulitis or open wounds. Lymph : No Cervical, Axillary, or Inguinal lymphadenopathy.      Labs Recent Results (from the past 2160 hour(s))  VAS [place] LOWER EXTREMITY ARTERIAL DUPLEX     Status: None (In process)   Collection Time: 02/09/17  9:01 AM  Result Value Ref Range   Left popliteal dist sys PSV 61 cm/s   Right peroneal sys min 0 m/s   Right popliteal dist sys PSV 64 cm/s   Right peroneal sys PSV -55 cm/s   Left ant tibial distal sys 75 cm/s   left ant tibial distal dia 0 cm/s   left post tibial dist sys 48 cm/s  left post tibial dist dia 0 cm/s   LEFT PERO DIST DIA 0 cm/s   LEFT PERO DIST SYS -84.00 cm/s   RIGHT ANT DIST TIBAL SYS PSV 118 cm/s   RIGHT ANT DIST TIBAL DIA PSV 0 cm/s   RIGHT POST TIB DIST SYS 104 cm/s   RIGHT POST TIB DIST DIA 9 cm/s   LEFT POPLITEAL DIST DYS VEL 0 cm/s   RIGHT POPLITEAL DIST EDV 0 cm/sec    Radiology No results found.    Assessment/Plan  Diabetes (HCC) blood glucose control important in reducing the progression of atherosclerotic disease. Also, involved in wound healing. On appropriate medications.   Hyperlipidemia lipid control important in reducing the progression of atherosclerotic disease. Continue statin therapy   PVD (peripheral vascular disease) ([place]) The patient is doing well without any current limb threatening symptoms. She had a recent surgery on her foot which should have ample blood flow to heal well based on her noninvasive studies today. These noninvasive studies showed normal triphasic waveforms and normal ABIs bilaterally. Her bypasses are patent bilaterally with only mildly elevated velocities proximal to the left femoral to popliteal bypass. This is stable from her previous study. Plan to recheck in 1 year with noninvasive studies unless worsening problems develop in the interim.    Leotis Pain, MD  02/09/2017 10:52  AM    This note was created with Dragon medical transcription system.  Any errors from dictation are purely unintentional

## 2017-02-15 ENCOUNTER — Ambulatory Visit (INDEPENDENT_AMBULATORY_CARE_PROVIDER_SITE_OTHER)
Admission: RE | Admit: 2017-02-15 | Discharge: 2017-02-15 | Disposition: A | Discharge status: Home / Self Care | Payer: Medicare Other | Source: Ambulatory Visit | Attending: Pulmonary Disease | Admitting: Pulmonary Disease

## 2017-02-15 DIAGNOSIS — R911 Solitary pulmonary nodule: Secondary | ICD-10-CM | POA: Diagnosis not present

## 2017-02-15 DIAGNOSIS — M205X2 Other deformities of toe(s) (acquired), left foot: Secondary | ICD-10-CM | POA: Diagnosis not present

## 2017-02-15 DIAGNOSIS — R918 Other nonspecific abnormal finding of lung field: Secondary | ICD-10-CM | POA: Diagnosis not present

## 2017-02-22 ENCOUNTER — Encounter: Payer: Self-pay | Admitting: Pulmonary Disease

## 2017-02-22 ENCOUNTER — Ambulatory Visit (INDEPENDENT_AMBULATORY_CARE_PROVIDER_SITE_OTHER): Payer: Medicare Other | Admitting: Pulmonary Disease

## 2017-02-22 VITALS — BP 130/72 | HR 87 | Ht 65.0 in | Wt 167.2 lb

## 2017-02-22 DIAGNOSIS — J438 Other emphysema: Secondary | ICD-10-CM | POA: Diagnosis not present

## 2017-02-22 DIAGNOSIS — R911 Solitary pulmonary nodule: Secondary | ICD-10-CM

## 2017-02-22 LAB — PULMONARY FUNCTION TEST
DL/VA % pred: 69 %
DL/VA: 3.4 ml/min/mmHg/L
DLCO cor % pred: 53 %
DLCO cor: 13.79 ml/min/mmHg
DLCO unc % pred: 54 %
DLCO unc: 14.08 ml/min/mmHg
FEF 25-75 Post: 2.47 L/sec
FEF 25-75 Pre: 1.39 L/sec
FEF2575-%Change-Post: 76 %
FEF2575-%Pred-Post: 144 %
FEF2575-%Pred-Pre: 81 %
FEV1-%Change-Post: 17 %
FEV1-%Pred-Post: 88 %
FEV1-%Pred-Pre: 75 %
FEV1-Post: 1.94 L
FEV1-Pre: 1.66 L
FEV1FVC-%Change-Post: -4 %
FEV1FVC-%Pred-Pre: 102 %
FEV6-%Change-Post: 22 %
FEV6-%Pred-Post: 93 %
FEV6-%Pred-Pre: 76 %
FEV6-Post: 2.62 L
FEV6-Pre: 2.13 L
FEV6FVC-%Change-Post: 0 %
FEV6FVC-%Pred-Post: 105 %
FEV6FVC-%Pred-Pre: 105 %
FVC-%Change-Post: 22 %
FVC-%Pred-Post: 89 %
FVC-%Pred-Pre: 73 %
FVC-Post: 2.62 L
FVC-Pre: 2.14 L
Post FEV1/FVC ratio: 74 %
Post FEV6/FVC ratio: 100 %
Pre FEV1/FVC ratio: 77 %
Pre FEV6/FVC Ratio: 100 %
RV % pred: 47 %
RV: 1.11 L
TLC % pred: 70 %
TLC: 3.66 L

## 2017-02-22 MED ORDER — BUDESONIDE-FORMOTEROL FUMARATE 80-4.5 MCG/ACT IN AERO: 2.0000 | INHALATION_SPRAY | Freq: Two times a day (BID) | RESPIRATORY_TRACT | 0 refills | Status: DC

## 2017-02-22 NOTE — Progress Notes (Addendum)
Deborah Waller    [id number]    04/27/1941  Primary Care Physician:Jennifer Owens Shark, NP  Referring Physician: Eloise Levels, NP [phone], [place] [address]  Chief complaint:   Follow-up for COPD   HPI: Deborah Waller is a 76 year old with past medical history of pulmonary nodules, mild COPD. She was previously followed by Dr. Barnet Pall, pulmonologist at [place], [place]. She moved here earlier this year to be closer to family. She is being followed for 3 subcentimeter pulmonary nodules with calcification suggestive of granulomas. She also has mild obstruction on her PFTs (I don't have the full study to review] and is maintained on Spiriva. She reports that the Spiriva is not working as well anymore. She reports worsening symptoms of dyspnea during the summer months when she is is out doors for golfing. She also reports chronic dyspnea on exertion on a daily basis that has been gradually worsening. She was previously on albuterol rescue inhaler but does not using it anymore. She denies any cough, sputum production, dyspnea at rest, wheezing, fevers, chills.  She is a 33 pack year smoking history. Quit in 1989. She was evaluated by sleep study in 2016. This was negative as per her pulmonologist records.  Interim history:  She states that her breathing is at baseline. She has dyspnea on exertion which is chronic and at baseline. She denies any cough, sputum production, wheezing, fevers, chills. Spiriva was changed to bevespi at last visit and she is tolerating this well.  Outpatient Encounter Prescriptions as of 02/22/2017  Medication Sig  . albuterol (PROVENTIL HFA;VENTOLIN HFA) 108 (90 Base) MCG/ACT inhaler Inhale 2 puffs into the lungs every 6 (six) hours as needed for wheezing or shortness of breath.  Marland Kitchen aspirin EC 81 MG tablet Take 81 mg by mouth daily.  . Cholecalciferol (VITAMIN D) 2000 units CAPS Take 2,000 Units by mouth daily.  Marland Kitchen escitalopram (LEXAPRO) 10 MG tablet  Take 10 mg by mouth daily.  . Glycopyrrolate-Formoterol (BEVESPI AEROSPHERE) 9-4.8 MCG/ACT AERO Inhale 2 puffs into the lungs 2 (two) times daily.  . hydrochlorothiazide (HYDRODIURIL) 25 MG tablet Take 25 mg by mouth daily.  . hydrocodone-ibuprofen (VICOPROFEN) 5-200 MG tablet Take 1 tablet by mouth every 8 (eight) hours as needed for pain.  . Ibuprofen-Famotidine (DUEXIS) 800-26.6 MG TABS Take by mouth 2 (two) times daily.  Marland Kitchen LORazepam (ATIVAN) 2 MG tablet 1 tab 1 1/2 hours before the procedure  . pregabalin (LYRICA) 150 MG capsule Take 150 mg by mouth 2 (two) times daily.  . rosuvastatin (CRESTOR) 5 MG tablet Take 5 mg by mouth daily.  . vitamin B-12 (CYANOCOBALAMIN) 1000 MCG tablet Take 1,000 mcg by mouth daily.  . [DISCONTINUED] celecoxib (CELEBREX) 200 MG capsule Take 200 mg by mouth daily.  . [DISCONTINUED] cholecalciferol (VITAMIN D) 400 units TABS tablet Take 400 Units by mouth.  . [DISCONTINUED] estradiol (ESTRACE) 0.1 MG/GM vaginal cream Place vaginally.  . [DISCONTINUED] hydrocodone-ibuprofen (VICOPROFEN) 5-200 MG tablet Take 1 tablet by mouth every 8 (eight) hours as needed for pain.  . [DISCONTINUED] Ibuprofen-Famotidine 800-26.6 MG TABS Take by mouth 2 (two) times daily.  . [DISCONTINUED] metFORMIN (GLUCOPHAGE-XR) 500 MG 24 hr tablet   . [DISCONTINUED] simvastatin (ZOCOR) 20 MG tablet Take by mouth.  . [DISCONTINUED] Tiotropium Bromide Monohydrate (SPIRIVA RESPIMAT) 1.25 MCG/ACT AERS Inhale into the lungs.  . Blood Glucose Monitoring Suppl (ACCU-CHEK AVIVA PLUS) w/Device KIT USE SEVERAL TIMES A WEEK UTD   No facility-administered encounter medications  on file as of 02/22/2017.     Allergies as of 02/22/2017 - Review Complete 02/22/2017  Allergen Reaction Noted  . Oxycodone  10/09/2016    Past Medical History:  Diagnosis Date  . Anxiety   . Neuropathy of both feet     Past Surgical History:  Procedure Laterality Date  . ABDOMINAL HYSTERECTOMY  1972  . ANKLE  RECONSTRUCTION Left 12/2007  . BREAST LUMPECTOMY Left   . BUNIONECTOMY Left 2002, 2003  . CATARACT EXTRACTION Left 11/2001  . CATARACT EXTRACTION Right 10/2001  . GALLBLADDER SURGERY  2000  . leg bypass- left  03/04/2011  . leg bypass-right   04/22/2011  . LIPOMA EXCISION  07/18/2014  . OOPHORECTOMY  1995  . REPLACEMENT TOTAL KNEE Right 12/08/2011  . TOE SURGERY Left 2010  . TOTAL HIP ARTHROPLASTY Right 11/13/2008  . TOTAL HIP ARTHROPLASTY Left 06/2005    Family History  Problem Relation Age of Onset  . Allergies Sister     Social History   Social History  . Marital status: Married    Spouse name: N/A  . Number of children: N/A  . Years of education: N/A   Occupational History  . Not on file.   Social History Main Topics  . Smoking status: Former Smoker    Packs/day: 1.00    Years: 33.00    Types: Cigarettes    Quit date: 11/10/1987  . Smokeless tobacco: Never Used  . Alcohol use No  . Drug use: No  . Sexual activity: Not on file   Other Topics Concern  . Not on file   Social History Narrative   Married, lives with husband, has one child, and is retired.      Review of systems: Review of Systems  Constitutional: Negative for fever and chills.  HENT: Negative.   Eyes: Negative for blurred vision.  Respiratory: as per HPI  Cardiovascular: Negative for chest pain and palpitations.  Gastrointestinal: Negative for vomiting, diarrhea, blood per rectum. Genitourinary: Negative for dysuria, urgency, frequency and hematuria.  Musculoskeletal: Negative for myalgias, back pain and joint pain.  Skin: Negative for itching and rash.  Neurological: Negative for dizziness, tremors, focal weakness, seizures and loss of consciousness.  Endo/Heme/Allergies: Negative for environmental allergies.  Psychiatric/Behavioral: Negative for depression, suicidal ideas and hallucinations.  All other systems reviewed and are negative.   Physical Exam: Blood pressure 130/72, pulse  87, height 5' 5"  (1.651 m), weight 167 lb 3.2 oz (75.8 kg), SpO2 93 %. Gen:      No acute distress HEENT:  EOMI, sclera anicteric Neck:     No masses; no thyromegaly Lungs:    Clear to auscultation bilaterally; normal respiratory effort CV:         Regular rate and rhythm; no murmurs Abd:      + bowel sounds; soft, non-tender; no palpable masses, no distension Ext:    No edema; adequate peripheral perfusion Skin:      Warm and dry; no rash Neuro: alert and oriented x 3 Psych: normal mood and affect  Data Reviewed: Data from previous pulmonologist reviewed including images (except CT scan from 2015) CT scan  07/30/14-. Small lung nodules CT scan 02/07/15- Small pulmonary nodule stable. CT scan 07/18/15-3 to 4 mm pulmonary nodule in the right upper lobe. 2-3 mm calcified nodule in the right lower lobe. 2-3 mm calcified granuloma in the left lower lobe. Subsegmental atelectasis in the right lower lobe. Diffuse emphysematous changes.  PFT 2016 -mild airflow obstruction, moderate  diffusion abnormality as per pulmonologist note. Actual study not sent.  Data from [facility] CT scan from here 4/9/1 8-4 mm right middle lobe nodule, 3 mm right lower lobe nodule, 2 mm right lower lobe nodule, 1-2 mm right lower lobe nodule, 1-2 mm left lower lobe nodule.  I have reviewed the images personally.  PFTs 02/22/17 FVC 2.62 [89%) FEV1 1.94 (88%) F/F 74 TLC 70% DLCO 54% Mild obstruction with bronchodilator response, mild restriction with moderate diffusion defect.  Assessment:  Mild COPD Spiriva was changed to symbicort at last visit but she has not noticed a change in symptoms. PFTs today show a bronchodilator response. We will give her sample of Symbicort to try. If she likes this better we can call in a prescription.  Pulmonary nodules Stable from 2015- 2016 with appearance suggestive of calcified granulomas. These are probably benign in nature. Repeat CT scan shows calcified pulmonary nodules which  at least by report appear stable compared to prior. She'll bring her prior imaging on CT for comparison.  Plan/Recommendations: - Stop bevespi, try symbicort samples - Obtain old CT scans for comparison  Marshell Garfinkel MD [facility] Pager [phone] 02/22/2017, 1:43 PM  CC: Eloise Levels, NP   Addendum: Received letter (07/02/17) from insurance that pt is non compliant with symbicort (last filled on 02/12/17). This will need to be addressed at next visit.  Marshell Garfinkel MD [facility] 07/16/2017, 10:37 AM

## 2017-02-22 NOTE — Progress Notes (Signed)
PFT done today. 

## 2017-02-22 NOTE — Patient Instructions (Signed)
We will give you sample of symbicort 80/4.5 Please let us know if this works better and we will call in a prescription  Follow up in 6 months.

## 2017-03-04 ENCOUNTER — Telehealth: Payer: Self-pay | Admitting: Pulmonary Disease

## 2017-03-04 MED ORDER — BUDESONIDE-FORMOTEROL FUMARATE 80-4.5 MCG/ACT IN AERO: 2.0000 | INHALATION_SPRAY | Freq: Two times a day (BID) | RESPIRATORY_TRACT | 3 refills | Status: DC

## 2017-03-04 NOTE — Telephone Encounter (Signed)
Spoke with pt, who request Rx for symbicort 80. Pt states she feels that symbicort is effective.  Rx has been sent to preferred pharmacy. Nothing further needed.

## 2017-03-08 ENCOUNTER — Encounter: Payer: Self-pay | Admitting: Podiatry

## 2017-03-08 ENCOUNTER — Ambulatory Visit (INDEPENDENT_AMBULATORY_CARE_PROVIDER_SITE_OTHER): Payer: Medicare Other | Admitting: Podiatry

## 2017-03-08 VITALS — BP 165/67 | HR 59 | Ht 65.0 in | Wt 165.0 lb

## 2017-03-08 DIAGNOSIS — M21619 Bunion of unspecified foot: Secondary | ICD-10-CM

## 2017-03-08 DIAGNOSIS — M2040 Other hammer toe(s) (acquired), unspecified foot: Secondary | ICD-10-CM | POA: Diagnosis not present

## 2017-03-08 DIAGNOSIS — G629 Polyneuropathy, unspecified: Secondary | ICD-10-CM

## 2017-03-08 NOTE — Patient Instructions (Signed)
Seen for abnormal digital contracture. Noted of stiffness in left first MPJ with contracted lesser joints. Noted of decreased sensory perception on both feet L>R. Severe Bunion deformity on right. Plantar callus under left heel and right bunion area debrided. There is no acute distress associated with deformities.  Return as needed.

## 2017-03-08 NOTE — Progress Notes (Signed)
SUBJECTIVE: 76 y.o. year old female accompanied by her husband presents for consultation on deformed toes. Stated that she has not had any problem wearing closed in shoes with deformed toes.  Stated that she has had bunion surgery with implant on left foot while she was living in [facility]. Also ankle surgery on left at that time.  She just had left great toe nails surgery done by Dr. Gershon Mussel. PShe was referred by Dr. Leonette Nutting.   REVIEW OF SYSTEMS: Pertinent items noted in HPI and remainder of comprehensive ROS otherwise negative.  OBJECTIVE: DERMATOLOGIC EXAMINATION: Post surgical nail border left hallux medial dry and healed well. Symptomatic plantar posterior callus left heel. Old incision lines over the first ray and lateral ankle area left foot.  VASCULAR EXAMINATION OF LOWER LIMBS: All pedal pulses are palpable with normal pulsation.  Capillary Filling times within 3 seconds in all digits.  No edema or erythema noted. Temperature gradient from tibial crest to dorsum of foot is within normal bilateral.  NEUROLOGIC EXAMINATION OF THE LOWER LIMBS: Failed sensory perception both feet L>R.  Sharp and Dull discriminatory sensations at the plantar ball of hallux is intact bilateral.   MUSCULOSKELETAL EXAMINATION: Positive for limited joint motion first MPJ left. Overlapping 2nd digit on top of the hallux left foot.  Hallux valgus with bunion deformity right.  ASSESSMENT: Hallux Valgus with bunion deformity right, asymptomatic. Hallux limitus, post surgical first MPJ left foot. Floating and overlapping 2nd toe on top of the first left foot. Painful keratoma plantar posterior left heel. Neuropathy peripheral.  PLAN: Reviewed clinical findings and available treatment options. Painful callus under left heel debrided. Patient will return for surgical consultation if digital deformities become symptomatic.

## 2017-03-10 ENCOUNTER — Ambulatory Visit: Payer: Medicare Other | Admitting: Podiatry

## 2017-03-23 DIAGNOSIS — M659 Synovitis and tenosynovitis, unspecified: Secondary | ICD-10-CM | POA: Diagnosis not present

## 2017-03-25 DIAGNOSIS — M7752 Other enthesopathy of left foot: Secondary | ICD-10-CM | POA: Diagnosis not present

## 2017-03-25 DIAGNOSIS — M659 Synovitis and tenosynovitis, unspecified: Secondary | ICD-10-CM | POA: Diagnosis not present

## 2017-03-25 DIAGNOSIS — I890 Lymphedema, not elsewhere classified: Secondary | ICD-10-CM | POA: Diagnosis not present

## 2017-04-07 ENCOUNTER — Ambulatory Visit: Payer: Medicare Other | Admitting: Podiatry

## 2017-04-07 DIAGNOSIS — S93491D Sprain of other ligament of right ankle, subsequent encounter: Secondary | ICD-10-CM | POA: Diagnosis not present

## 2017-04-07 DIAGNOSIS — M659 Synovitis and tenosynovitis, unspecified: Secondary | ICD-10-CM | POA: Diagnosis not present

## 2017-04-16 DIAGNOSIS — G629 Polyneuropathy, unspecified: Secondary | ICD-10-CM | POA: Diagnosis not present

## 2017-04-16 DIAGNOSIS — E1165 Type 2 diabetes mellitus with hyperglycemia: Secondary | ICD-10-CM | POA: Diagnosis not present

## 2017-04-16 DIAGNOSIS — E784 Other hyperlipidemia: Secondary | ICD-10-CM | POA: Diagnosis not present

## 2017-04-16 DIAGNOSIS — I10 Essential (primary) hypertension: Secondary | ICD-10-CM | POA: Diagnosis not present

## 2017-04-16 DIAGNOSIS — F419 Anxiety disorder, unspecified: Secondary | ICD-10-CM | POA: Diagnosis not present

## 2017-04-16 DIAGNOSIS — R269 Unspecified abnormalities of gait and mobility: Secondary | ICD-10-CM | POA: Diagnosis not present

## 2017-04-16 DIAGNOSIS — I739 Peripheral vascular disease, unspecified: Secondary | ICD-10-CM | POA: Diagnosis not present

## 2017-04-21 DIAGNOSIS — S92301A Fracture of unspecified metatarsal bone(s), right foot, initial encounter for closed fracture: Secondary | ICD-10-CM | POA: Diagnosis not present

## 2017-04-21 DIAGNOSIS — S93491D Sprain of other ligament of right ankle, subsequent encounter: Secondary | ICD-10-CM | POA: Diagnosis not present

## 2017-04-22 DIAGNOSIS — H04123 Dry eye syndrome of bilateral lacrimal glands: Secondary | ICD-10-CM | POA: Diagnosis not present

## 2017-04-22 DIAGNOSIS — H43812 Vitreous degeneration, left eye: Secondary | ICD-10-CM | POA: Diagnosis not present

## 2017-04-22 DIAGNOSIS — Z961 Presence of intraocular lens: Secondary | ICD-10-CM | POA: Diagnosis not present

## 2017-04-28 DIAGNOSIS — S93491D Sprain of other ligament of right ankle, subsequent encounter: Secondary | ICD-10-CM | POA: Diagnosis not present

## 2017-04-28 DIAGNOSIS — S92301A Fracture of unspecified metatarsal bone(s), right foot, initial encounter for closed fracture: Secondary | ICD-10-CM | POA: Diagnosis not present

## 2017-04-30 DIAGNOSIS — F411 Generalized anxiety disorder: Secondary | ICD-10-CM | POA: Diagnosis not present

## 2017-04-30 DIAGNOSIS — G629 Polyneuropathy, unspecified: Secondary | ICD-10-CM | POA: Diagnosis not present

## 2017-04-30 DIAGNOSIS — E784 Other hyperlipidemia: Secondary | ICD-10-CM | POA: Diagnosis not present

## 2017-04-30 DIAGNOSIS — E119 Type 2 diabetes mellitus without complications: Secondary | ICD-10-CM | POA: Diagnosis not present

## 2017-04-30 DIAGNOSIS — S99921D Unspecified injury of right foot, subsequent encounter: Secondary | ICD-10-CM | POA: Diagnosis not present

## 2017-04-30 DIAGNOSIS — I10 Essential (primary) hypertension: Secondary | ICD-10-CM | POA: Diagnosis not present

## 2017-04-30 DIAGNOSIS — E1165 Type 2 diabetes mellitus with hyperglycemia: Secondary | ICD-10-CM | POA: Diagnosis not present

## 2017-05-03 DIAGNOSIS — M25571 Pain in right ankle and joints of right foot: Secondary | ICD-10-CM | POA: Diagnosis not present

## 2017-05-19 DIAGNOSIS — S93491D Sprain of other ligament of right ankle, subsequent encounter: Secondary | ICD-10-CM | POA: Diagnosis not present

## 2017-05-19 DIAGNOSIS — M659 Synovitis and tenosynovitis, unspecified: Secondary | ICD-10-CM | POA: Diagnosis not present

## 2017-05-19 DIAGNOSIS — S92301D Fracture of unspecified metatarsal bone(s), right foot, subsequent encounter for fracture with routine healing: Secondary | ICD-10-CM | POA: Diagnosis not present

## 2017-05-24 ENCOUNTER — Ambulatory Visit
Admission: RE | Admit: 2017-05-24 | Discharge: 2017-05-24 | Disposition: A | Discharge status: Home / Self Care | Payer: Self-pay | Source: Ambulatory Visit | Attending: Pulmonary Disease | Admitting: Pulmonary Disease

## 2017-05-24 ENCOUNTER — Other Ambulatory Visit: Payer: Self-pay | Admitting: Pulmonary Disease

## 2017-05-24 DIAGNOSIS — M25571 Pain in right ankle and joints of right foot: Secondary | ICD-10-CM | POA: Diagnosis not present

## 2017-05-24 DIAGNOSIS — R911 Solitary pulmonary nodule: Secondary | ICD-10-CM

## 2017-07-19 DIAGNOSIS — S93492A Sprain of other ligament of left ankle, initial encounter: Secondary | ICD-10-CM | POA: Diagnosis not present

## 2017-07-19 DIAGNOSIS — S8265XA Nondisplaced fracture of lateral malleolus of left fibula, initial encounter for closed fracture: Secondary | ICD-10-CM | POA: Diagnosis not present

## 2017-07-19 DIAGNOSIS — M19072 Primary osteoarthritis, left ankle and foot: Secondary | ICD-10-CM | POA: Diagnosis not present

## 2017-07-26 DIAGNOSIS — S8265XD Nondisplaced fracture of lateral malleolus of left fibula, subsequent encounter for closed fracture with routine healing: Secondary | ICD-10-CM | POA: Diagnosis not present

## 2017-07-26 DIAGNOSIS — M7752 Other enthesopathy of left foot: Secondary | ICD-10-CM | POA: Diagnosis not present

## 2017-08-05 DIAGNOSIS — Z23 Encounter for immunization: Secondary | ICD-10-CM | POA: Diagnosis not present

## 2017-08-06 DIAGNOSIS — D1801 Hemangioma of skin and subcutaneous tissue: Secondary | ICD-10-CM | POA: Diagnosis not present

## 2017-08-06 DIAGNOSIS — D485 Neoplasm of uncertain behavior of skin: Secondary | ICD-10-CM | POA: Diagnosis not present

## 2017-08-06 DIAGNOSIS — D229 Melanocytic nevi, unspecified: Secondary | ICD-10-CM | POA: Diagnosis not present

## 2017-08-06 DIAGNOSIS — L821 Other seborrheic keratosis: Secondary | ICD-10-CM | POA: Diagnosis not present

## 2017-08-06 DIAGNOSIS — L814 Other melanin hyperpigmentation: Secondary | ICD-10-CM | POA: Diagnosis not present

## 2017-08-06 DIAGNOSIS — L728 Other follicular cysts of the skin and subcutaneous tissue: Secondary | ICD-10-CM | POA: Diagnosis not present

## 2017-08-11 DIAGNOSIS — S8265XD Nondisplaced fracture of lateral malleolus of left fibula, subsequent encounter for closed fracture with routine healing: Secondary | ICD-10-CM | POA: Diagnosis not present

## 2017-08-11 DIAGNOSIS — S92301D Fracture of unspecified metatarsal bone(s), right foot, subsequent encounter for fracture with routine healing: Secondary | ICD-10-CM | POA: Diagnosis not present

## 2017-08-13 DIAGNOSIS — I10 Essential (primary) hypertension: Secondary | ICD-10-CM | POA: Diagnosis not present

## 2017-08-13 DIAGNOSIS — M8580 Other specified disorders of bone density and structure, unspecified site: Secondary | ICD-10-CM | POA: Diagnosis not present

## 2017-08-13 DIAGNOSIS — E663 Overweight: Secondary | ICD-10-CM | POA: Diagnosis not present

## 2017-08-13 DIAGNOSIS — R413 Other amnesia: Secondary | ICD-10-CM | POA: Diagnosis not present

## 2017-08-13 DIAGNOSIS — M79605 Pain in left leg: Secondary | ICD-10-CM | POA: Diagnosis not present

## 2017-08-13 DIAGNOSIS — E119 Type 2 diabetes mellitus without complications: Secondary | ICD-10-CM | POA: Diagnosis not present

## 2017-08-13 DIAGNOSIS — Z6828 Body mass index (BMI) 28.0-28.9, adult: Secondary | ICD-10-CM | POA: Diagnosis not present

## 2017-08-13 DIAGNOSIS — Z96643 Presence of artificial hip joint, bilateral: Secondary | ICD-10-CM | POA: Diagnosis not present

## 2017-08-23 ENCOUNTER — Encounter: Payer: Self-pay | Admitting: Pulmonary Disease

## 2017-08-23 ENCOUNTER — Ambulatory Visit (INDEPENDENT_AMBULATORY_CARE_PROVIDER_SITE_OTHER): Payer: Medicare Other | Admitting: Pulmonary Disease

## 2017-08-23 VITALS — BP 130/80 | HR 63 | Ht 65.5 in | Wt 169.8 lb

## 2017-08-23 DIAGNOSIS — R911 Solitary pulmonary nodule: Secondary | ICD-10-CM

## 2017-08-23 DIAGNOSIS — J438 Other emphysema: Secondary | ICD-10-CM | POA: Diagnosis not present

## 2017-08-23 NOTE — Patient Instructions (Signed)
It is okay to stop the Symbicort as it is not working for you and because it is causing wheezing Continue the albuterol inhaler as needed and I'll see you back in 6 months. Please call if there is any change in his symptoms.

## 2017-08-23 NOTE — Progress Notes (Signed)
Deborah Waller    [id number]    [birth year]  Primary Care Physician:Brown, Anderson Malta, NP  Referring Physician: Eloise Levels, NP [phone], [place] [address]  Chief complaint:   Follow-up for COPD   HPI: Deborah Waller is a 76 year old with past medical history of pulmonary nodules, mild COPD. She was previously followed by Dr. Barnet Pall, pulmonologist at [place], [place]. She moved here earlier this year to be closer to family. She is being followed for 3 subcentimeter pulmonary nodules with calcification suggestive of granulomas. She also has mild obstruction on her PFTs (I don't have the full study to review] and is maintained on Spiriva. She reports that the Spiriva is not working as well anymore. She reports worsening symptoms of dyspnea during the summer months when she is is out doors for golfing. She also reports chronic dyspnea on exertion on a daily basis that has been gradually worsening. She was previously on albuterol rescue inhaler but does not using it anymore. She denies any cough, sputum production, dyspnea at rest, wheezing, fevers, chills.  She is a 33 pack year smoking history. Quit in 1989. She was evaluated by sleep study in 2016. This was negative as per her pulmonologist records.  Interim history:  She has tried Spiriva and Bevespi with no change in symptoms. She also tried Symbicort but stopped using it as she felt it made the wheezing worse. Denies any excessive dyspnea. She has occasional dyspnea on exertion and uses albuterol which helps. She has used albuterol only a couple of times over the past couple of months.  Outpatient Encounter Prescriptions as of 08/23/2017  Medication Sig  . albuterol (PROVENTIL HFA;VENTOLIN HFA) 108 (90 Base) MCG/ACT inhaler Inhale 2 puffs into the lungs every 6 (six) hours as needed for wheezing or shortness of breath.  Marland Kitchen aspirin EC 81 MG tablet Take 81 mg by mouth daily.  . Blood Glucose Monitoring Suppl (ACCU-CHEK  AVIVA PLUS) w/Device KIT USE SEVERAL TIMES A WEEK UTD  . Cholecalciferol (VITAMIN D) 2000 units CAPS Take 2,000 Units by mouth daily.  . Ibuprofen-Famotidine (DUEXIS) 800-26.6 MG TABS Take by mouth 2 (two) times daily.  Marland Kitchen LORazepam (ATIVAN) 2 MG tablet 1 tab 1 1/2 hours before the procedure  . pregabalin (LYRICA) 150 MG capsule Take 150 mg by mouth 2 (two) times daily.  . rosuvastatin (CRESTOR) 5 MG tablet Take 5 mg by mouth daily.  . vitamin B-12 (CYANOCOBALAMIN) 1000 MCG tablet Take 1,000 mcg by mouth daily.  . budesonide-formoterol (SYMBICORT) 80-4.5 MCG/ACT inhaler Inhale 2 puffs into the lungs 2 (two) times daily. (Patient not taking: Reported on 08/23/2017)  . [DISCONTINUED] escitalopram (LEXAPRO) 10 MG tablet Take 10 mg by mouth daily.  . [DISCONTINUED] Glycopyrrolate-Formoterol (BEVESPI AEROSPHERE) 9-4.8 MCG/ACT AERO Inhale 2 puffs into the lungs 2 (two) times daily.  . [DISCONTINUED] hydrochlorothiazide (HYDRODIURIL) 25 MG tablet Take 25 mg by mouth daily.  . [DISCONTINUED] hydrocodone-ibuprofen (VICOPROFEN) 5-200 MG tablet Take 1 tablet by mouth every 8 (eight) hours as needed for pain.   No facility-administered encounter medications on file as of 08/23/2017.     Allergies as of 08/23/2017 - Review Complete 08/23/2017  Allergen Reaction Noted  . Oxycodone  10/09/2016    Past Medical History:  Diagnosis Date  . Anxiety   . Neuropathy of both feet     Past Surgical History:  Procedure Laterality Date  . ABDOMINAL HYSTERECTOMY  1972  . ANKLE RECONSTRUCTION Left 12/2007  .  BREAST LUMPECTOMY Left   . BUNIONECTOMY Left 2002, 2003  . CATARACT EXTRACTION Left 11/2001  . CATARACT EXTRACTION Right 10/2001  . GALLBLADDER SURGERY  2000  . leg bypass- left  03/04/2011  . leg bypass-right   04/22/2011  . LIPOMA EXCISION  07/18/2014  . OOPHORECTOMY  1995  . REPLACEMENT TOTAL KNEE Right 12/08/2011  . TOE SURGERY Left 2010  . TOTAL HIP ARTHROPLASTY Right 11/13/2008  . TOTAL HIP  ARTHROPLASTY Left 06/2005    Family History  Problem Relation Age of Onset  . Allergies Sister     Social History   Social History  . Marital status: Married    Spouse name: N/A  . Number of children: N/A  . Years of education: N/A   Occupational History  . Not on file.   Social History Main Topics  . Smoking status: Former Smoker    Packs/day: 1.00    Years: 33.00    Types: Cigarettes    Quit date: 11/10/1987  . Smokeless tobacco: Never Used  . Alcohol use No  . Drug use: No  . Sexual activity: Not on file   Other Topics Concern  . Not on file   Social History Narrative   Married, lives with husband, has one child, and is retired.    Review of systems: Review of Systems  Constitutional: Negative for fever and chills.  HENT: Negative.   Eyes: Negative for blurred vision.  Respiratory: as per HPI  Cardiovascular: Negative for chest pain and palpitations.  Gastrointestinal: Negative for vomiting, diarrhea, blood per rectum. Genitourinary: Negative for dysuria, urgency, frequency and hematuria.  Musculoskeletal: Negative for myalgias, back pain and joint pain.  Skin: Negative for itching and rash.  Neurological: Negative for dizziness, tremors, focal weakness, seizures and loss of consciousness.  Endo/Heme/Allergies: Negative for environmental allergies.  Psychiatric/Behavioral: Negative for depression, suicidal ideas and hallucinations.  All other systems reviewed and are negative.  Physical Exam: Blood pressure 130/72, pulse 87, height 5' 5" (1.651 m), weight 167 lb 3.2 oz (75.8 kg), SpO2 93 %. Gen:      No acute distress HEENT:  EOMI, sclera anicteric Neck:     No masses; no thyromegaly Lungs:    Clear to auscultation bilaterally; normal respiratory effort CV:         Regular rate and rhythm; no murmurs Abd:      + bowel sounds; soft, non-tender; no palpable masses, no distension Ext:    No edema; adequate peripheral perfusion Skin:      Warm and dry; no  rash Neuro: alert and oriented x 3 Psych: normal mood and affect  Data Reviewed: Data from previous pulmonologist reviewed including images (except CT scan from 2015) CT scan  07/30/14-. Small lung nodules CT scan 02/07/15- Small pulmonary nodule stable. CT scan 07/18/15-3 to 4 mm pulmonary nodule in the right upper lobe. 2-3 mm calcified nodule in the right lower lobe. 2-3 mm calcified granuloma in the left lower lobe. Subsegmental atelectasis in the right lower lobe. Diffuse emphysematous changes.  PFT 2016 -mild airflow obstruction, moderate diffusion abnormality as per pulmonologist note. Actual study not sent.  Data from [facility] CT scan from here 02/15/17-4 mm right middle lobe nodule, 3 mm right lower lobe nodule, 2 mm right lower lobe nodule, 1-2 mm right lower lobe nodule, 1-2 mm left lower lobe nodule.  I have reviewed the images personally.  PFTs 02/22/17 FVC 2.62 [89%), FEV1 1.94 (88%), F/F 74, TLC 70%, DLCO 54% Mild obstruction with  bronchodilator response, mild restriction with moderate diffusion defect.  Assessment:  Mild COPD Does not feel that any contoller inhalers are helping or needed. She feels that just albuterol is helping but does not need to use it very often. We will just continue the albuterol inhaler as needed for now.  Pulmonary nodules Stable from 2015- 2016 with appearance suggestive of calcified granulomas. These are probably benign in nature. Repeat CT scan shows calcified pulmonary nodules which at least by report appear stable compared to prior. She'll bring her prior imaging on CT for comparison.  Plan/Recommendations: - Continue albuterol inhaler - Obtain old CT scans for comparison  Marshell Garfinkel MD [facility] Pager [phone] 08/23/2017, 10:04 AM  CC: Eloise Levels, NP

## 2017-08-26 ENCOUNTER — Telehealth: Payer: Self-pay | Admitting: Pulmonary Disease

## 2017-08-26 NOTE — Telephone Encounter (Signed)
Pt stated that when her appt with PM she dropped off her copy of ct scan on her liver from her pulmonologist that she saw back in [place]---Dr. Barnet Pall and his number is [phone].  She stated that if PM does not have this then we can call for this copy to be faxed.

## 2017-08-30 NOTE — Telephone Encounter (Signed)
Checked PM's look at cubby and did not see anything on this patient.  Raquel Sarna please advise if you recall this info being dropped off, or if we need to re-request this info.  Thanks!

## 2017-09-01 NOTE — Telephone Encounter (Signed)
Called Dr. Beatris Ship office in [place] to see if they could fax over her CT scans to [place]. Spoke with Loyola Mast and she told me she would fax those to our office. Nothing further needed at this time.

## 2017-10-04 ENCOUNTER — Other Ambulatory Visit: Payer: Self-pay | Admitting: Internal Medicine

## 2017-10-04 DIAGNOSIS — Z1231 Encounter for screening mammogram for malignant neoplasm of breast: Secondary | ICD-10-CM

## 2017-10-25 DIAGNOSIS — R05 Cough: Secondary | ICD-10-CM | POA: Diagnosis not present

## 2017-10-25 DIAGNOSIS — R110 Nausea: Secondary | ICD-10-CM | POA: Diagnosis not present

## 2017-10-25 DIAGNOSIS — J029 Acute pharyngitis, unspecified: Secondary | ICD-10-CM | POA: Diagnosis not present

## 2017-10-28 ENCOUNTER — Ambulatory Visit: Payer: Medicare Other

## 2017-11-10 DIAGNOSIS — R110 Nausea: Secondary | ICD-10-CM | POA: Diagnosis not present

## 2017-11-10 DIAGNOSIS — T39394D Poisoning by other nonsteroidal anti-inflammatory drugs [NSAID], undetermined, subsequent encounter: Secondary | ICD-10-CM | POA: Diagnosis not present

## 2017-11-10 DIAGNOSIS — K2900 Acute gastritis without bleeding: Secondary | ICD-10-CM | POA: Diagnosis not present

## 2017-11-24 ENCOUNTER — Other Ambulatory Visit: Payer: Self-pay | Admitting: Family Medicine

## 2017-11-24 ENCOUNTER — Ambulatory Visit
Admission: RE | Admit: 2017-11-24 | Discharge: 2017-11-24 | Disposition: A | Discharge status: Home / Self Care | Payer: Medicare Other | Source: Ambulatory Visit | Attending: Internal Medicine | Admitting: Internal Medicine

## 2017-11-24 DIAGNOSIS — Z1231 Encounter for screening mammogram for malignant neoplasm of breast: Secondary | ICD-10-CM

## 2017-11-24 DIAGNOSIS — G629 Polyneuropathy, unspecified: Secondary | ICD-10-CM | POA: Diagnosis not present

## 2017-11-24 DIAGNOSIS — E119 Type 2 diabetes mellitus without complications: Secondary | ICD-10-CM | POA: Diagnosis not present

## 2017-11-24 DIAGNOSIS — K5900 Constipation, unspecified: Secondary | ICD-10-CM | POA: Diagnosis not present

## 2017-11-24 DIAGNOSIS — R110 Nausea: Secondary | ICD-10-CM | POA: Diagnosis not present

## 2017-11-24 DIAGNOSIS — Z1389 Encounter for screening for other disorder: Secondary | ICD-10-CM | POA: Diagnosis not present

## 2017-11-24 DIAGNOSIS — F419 Anxiety disorder, unspecified: Secondary | ICD-10-CM | POA: Diagnosis not present

## 2017-12-08 ENCOUNTER — Other Ambulatory Visit: Payer: Self-pay | Admitting: Gastroenterology

## 2017-12-08 DIAGNOSIS — R634 Abnormal weight loss: Secondary | ICD-10-CM | POA: Diagnosis not present

## 2017-12-08 DIAGNOSIS — K5900 Constipation, unspecified: Secondary | ICD-10-CM | POA: Diagnosis not present

## 2017-12-08 DIAGNOSIS — R110 Nausea: Secondary | ICD-10-CM | POA: Diagnosis not present

## 2017-12-08 DIAGNOSIS — R1084 Generalized abdominal pain: Secondary | ICD-10-CM | POA: Diagnosis not present

## 2017-12-09 DIAGNOSIS — R634 Abnormal weight loss: Secondary | ICD-10-CM | POA: Diagnosis not present

## 2017-12-15 ENCOUNTER — Ambulatory Visit
Admission: RE | Admit: 2017-12-15 | Discharge: 2017-12-15 | Disposition: A | Discharge status: Home / Self Care | Payer: Medicare Other | Source: Ambulatory Visit | Attending: Gastroenterology | Admitting: Gastroenterology

## 2017-12-15 DIAGNOSIS — R634 Abnormal weight loss: Secondary | ICD-10-CM

## 2017-12-15 MED ORDER — IOPAMIDOL (ISOVUE-300) INJECTION 61%
100.0000 mL | Freq: Once | INTRAVENOUS | Status: AC | PRN
  Administered 2017-12-15: 100 mL via INTRAVENOUS

## 2017-12-27 DIAGNOSIS — K581 Irritable bowel syndrome with constipation: Secondary | ICD-10-CM | POA: Diagnosis not present

## 2018-01-21 ENCOUNTER — Ambulatory Visit
Admission: RE | Admit: 2018-01-21 | Discharge: 2018-01-21 | Disposition: A | Discharge status: Home / Self Care | Payer: Medicare Other | Source: Ambulatory Visit | Attending: Family Medicine | Admitting: Family Medicine

## 2018-01-21 DIAGNOSIS — Z1231 Encounter for screening mammogram for malignant neoplasm of breast: Secondary | ICD-10-CM | POA: Diagnosis not present

## 2018-02-02 DIAGNOSIS — D1801 Hemangioma of skin and subcutaneous tissue: Secondary | ICD-10-CM | POA: Diagnosis not present

## 2018-02-02 DIAGNOSIS — L821 Other seborrheic keratosis: Secondary | ICD-10-CM | POA: Diagnosis not present

## 2018-02-02 DIAGNOSIS — D225 Melanocytic nevi of trunk: Secondary | ICD-10-CM | POA: Diagnosis not present

## 2018-02-11 ENCOUNTER — Ambulatory Visit (INDEPENDENT_AMBULATORY_CARE_PROVIDER_SITE_OTHER): Payer: Medicare Other

## 2018-02-11 ENCOUNTER — Other Ambulatory Visit (INDEPENDENT_AMBULATORY_CARE_PROVIDER_SITE_OTHER): Payer: Self-pay | Admitting: Vascular Surgery

## 2018-02-11 ENCOUNTER — Encounter (INDEPENDENT_AMBULATORY_CARE_PROVIDER_SITE_OTHER): Payer: Self-pay | Admitting: Vascular Surgery

## 2018-02-11 ENCOUNTER — Encounter (INDEPENDENT_AMBULATORY_CARE_PROVIDER_SITE_OTHER): Payer: Self-pay

## 2018-02-11 ENCOUNTER — Ambulatory Visit (INDEPENDENT_AMBULATORY_CARE_PROVIDER_SITE_OTHER): Payer: Medicare Other | Admitting: Vascular Surgery

## 2018-02-11 VITALS — BP 127/63 | HR 89 | Resp 14 | Ht 65.0 in | Wt 154.0 lb

## 2018-02-11 DIAGNOSIS — I739 Peripheral vascular disease, unspecified: Secondary | ICD-10-CM

## 2018-02-11 NOTE — Progress Notes (Signed)
Patient left before being seen.

## 2018-02-21 ENCOUNTER — Ambulatory Visit (INDEPENDENT_AMBULATORY_CARE_PROVIDER_SITE_OTHER): Payer: Medicare Other | Admitting: Pulmonary Disease

## 2018-02-21 ENCOUNTER — Encounter: Payer: Self-pay | Admitting: Pulmonary Disease

## 2018-02-21 VITALS — BP 124/70 | HR 77 | Ht 65.0 in | Wt 154.0 lb

## 2018-02-21 DIAGNOSIS — J438 Other emphysema: Secondary | ICD-10-CM | POA: Diagnosis not present

## 2018-02-21 DIAGNOSIS — R911 Solitary pulmonary nodule: Secondary | ICD-10-CM | POA: Diagnosis not present

## 2018-02-21 NOTE — Patient Instructions (Signed)
I am glad you are doing well with your breathing Continue the albuterol as needed Please check with the insurance company and let us know if you need a new prescription called in Follow-up in 6 months.

## 2018-02-21 NOTE — Progress Notes (Signed)
Deborah Waller    [id number]    [birth year]  Primary Care Physician:Sun, Gari Crown, MD  Referring Physician: Leeroy Cha, MD [address] [place], [place] [address]  Chief complaint:   Follow-up for COPD   HPI: Deborah Waller is a 77 year old with past medical history of pulmonary nodules, mild COPD. She was previously followed by Dr. Barnet Pall, pulmonologist at [place], [place]. She moved here earlier this year to be closer to family. She is being followed for 3 subcentimeter pulmonary nodules with calcification suggestive of granulomas. She also has mild obstruction on her PFTs (I don't have the full study to review] and is maintained on Spiriva. She reports that the Spiriva is not working as well anymore. She reports worsening symptoms of dyspnea during the summer months when she is is out doors for golfing. She also reports chronic dyspnea on exertion on a daily basis that has been gradually worsening. She was previously on albuterol rescue inhaler but does not using it anymore. She denies any cough, sputum production, dyspnea at rest, wheezing, fevers, chills.  She is a 33 pack year smoking history. Quit in 1989. She was evaluated by sleep study in 2016. This was negative as per her pulmonologist records.  Interim history:   She has tried Spiriva and Bevespi with no change in symptoms. Symbicort made the wheezing worse.   Since last visit her breathing has been stable.  She is had to use her albuterol rescue inhaler only 3 times over 6 months.  She is not on any controller medication. Being followed by Dr. Acie Fredrickson GI for irritable bowel syndrome and GERD.  Outpatient Encounter Medications as of 02/21/2018  Medication Sig  . albuterol (PROVENTIL HFA;VENTOLIN HFA) 108 (90 Base) MCG/ACT inhaler Inhale 2 puffs into the lungs every 6 (six) hours as needed for wheezing or shortness of breath.  Marland Kitchen aspirin EC 81 MG tablet Take 81 mg by mouth daily.  . Blood  Glucose Monitoring Suppl (ACCU-CHEK AVIVA PLUS) w/Device KIT USE SEVERAL TIMES A WEEK UTD  . Cholecalciferol (VITAMIN D) 2000 units CAPS Take 2,000 Units by mouth daily.  Marland Kitchen dicyclomine (BENTYL) 20 MG tablet TK 1 T PO QID PRN  . linaclotide (LINZESS) 145 MCG CAPS capsule Take 145 mcg by mouth daily before breakfast.  . LORazepam (ATIVAN) 2 MG tablet 1 tab 1 1/2 hours before the procedure  . ondansetron (ZOFRAN) 4 MG tablet ondansetron HCl 4 mg tablet  . pregabalin (LYRICA) 150 MG capsule Take 150 mg by mouth 2 (two) times daily.  . rosuvastatin (CRESTOR) 5 MG tablet Take 5 mg by mouth daily.  . vitamin B-12 (CYANOCOBALAMIN) 1000 MCG tablet Take 1,000 mcg by mouth daily.  Marland Kitchen vortioxetine HBr (TRINTELLIX) 5 MG TABS tablet Take 5 mg by mouth daily.  . [DISCONTINUED] Ibuprofen-Famotidine (DUEXIS) 800-26.6 MG TABS Take by mouth 2 (two) times daily.   No facility-administered encounter medications on file as of 02/21/2018.     Allergies as of 02/21/2018 - Review Complete 02/21/2018  Allergen Reaction Noted  . Oxycodone  10/09/2016    Past Medical History:  Diagnosis Date  . Anxiety   . Neuropathy of both feet     Past Surgical History:  Procedure Laterality Date  . ABDOMINAL HYSTERECTOMY  1972  . ANKLE RECONSTRUCTION Left 12/2007  . BREAST EXCISIONAL BIOPSY Right   . BREAST LUMPECTOMY Left   . BUNIONECTOMY Left 2002, 2003  . CATARACT EXTRACTION Left 11/2001  .  CATARACT EXTRACTION Right 10/2001  . GALLBLADDER SURGERY  2000  . leg bypass- left  03/04/2011  . leg bypass-right   04/22/2011  . LIPOMA EXCISION  07/18/2014  . OOPHORECTOMY  1995  . REPLACEMENT TOTAL KNEE Right 12/08/2011  . TOE SURGERY Left 2010  . TOTAL HIP ARTHROPLASTY Right 11/13/2008  . TOTAL HIP ARTHROPLASTY Left 06/2005    Family History  Problem Relation Age of Onset  . Allergies Sister     Social History   Socioeconomic History  . Marital status: Married    Spouse name: Not on file  . Number of  children: Not on file  . Years of education: Not on file  . Highest education level: Not on file  Occupational History  . Not on file  Social Needs  . Financial resource strain: Not on file  . Food insecurity:    Worry: Not on file    Inability: Not on file  . Transportation needs:    Medical: Not on file    Non-medical: Not on file  Tobacco Use  . Smoking status: Former Smoker    Packs/day: 1.00    Years: 33.00    Pack years: 33.00    Types: Cigarettes    Last attempt to quit: 11/10/1987    Years since quitting: 30.3  . Smokeless tobacco: Never Used  Substance and Sexual Activity  . Alcohol use: No  . Drug use: No  . Sexual activity: Not on file  Lifestyle  . Physical activity:    Days per week: Not on file    Minutes per session: Not on file  . Stress: Not on file  Relationships  . Social connections:    Talks on phone: Not on file    Gets together: Not on file    Attends religious service: Not on file    Active member of club or organization: Not on file    Attends meetings of clubs or organizations: Not on file    Relationship status: Not on file  . Intimate partner violence:    Fear of current or ex partner: Not on file    Emotionally abused: Not on file    Physically abused: Not on file    Forced sexual activity: Not on file  Other Topics Concern  . Not on file  Social History Narrative   Married, lives with husband, has one child, and is retired.    Review of systems: Review of Systems  Constitutional: Negative for fever and chills.  HENT: Negative.   Eyes: Negative for blurred vision.  Respiratory: as per HPI  Cardiovascular: Negative for chest pain and palpitations.  Gastrointestinal: Negative for vomiting, diarrhea, blood per rectum. Genitourinary: Negative for dysuria, urgency, frequency and hematuria.  Musculoskeletal: Negative for myalgias, back pain and joint pain.  Skin: Negative for itching and rash.  Neurological: Negative for dizziness,  tremors, focal weakness, seizures and loss of consciousness.  Endo/Heme/Allergies: Negative for environmental allergies.  Psychiatric/Behavioral: Negative for depression, suicidal ideas and hallucinations.  All other systems reviewed and are negative.  Physical Exam: Blood pressure 124/70, pulse 77, height _0  (1.651 m), weight 154 lb (69.9 kg), SpO2 96 %. Gen:      No acute distress HEENT:  EOMI, sclera anicteric Neck:     No masses; no thyromegaly Lungs:    Clear to auscultation bilaterally; normal respiratory effort CV:         Regular rate and rhythm; no murmurs Abd:      +  bowel sounds; soft, non-tender; no palpable masses, no distension Ext:    No edema; adequate peripheral perfusion Skin:      Warm and dry; no rash Neuro: alert and oriented x 3 Psych: normal mood and affect  Data Reviewed: Data from previous pulmonologist reviewed including images (except CT scan from 2015) CT scan  07/30/14-. Small lung nodules CT scan 02/07/15- Small pulmonary nodule stable. CT scan 07/18/15-3 to 4 mm pulmonary nodule in the right upper lobe. 2-3 mm calcified nodule in the right lower lobe. 2-3 mm calcified granuloma in the left lower lobe. Subsegmental atelectasis in the right lower lobe. Diffuse emphysematous changes.  PFT 2016 -mild airflow obstruction, moderate diffusion abnormality as per pulmonologist note. Actual study not sent.  Data from [facility] CT scan from here 02/15/17-4 mm right middle lobe nodule, 3 mm right lower lobe nodule, 2 mm right lower lobe nodule, 1-2 mm right lower lobe nodule, 1-2 mm left lower lobe nodule.   CT abdomen pelvis 12/25/17-stable lower lobe calcified nodules.  Lung images are otherwise normal Reviewed the images personally.  PFTs 02/22/17 FVC 2.62 [89%), FEV1 1.94 (88%), F/F 74, TLC 70%, DLCO 54% Mild obstruction with bronchodilator response, mild restriction with moderate diffusion defect.  Assessment:  Mild emphysema, COPD Stable on albuterol as  needed.  No need for controller medication as she is asymptomatic.  Pulmonary nodules CT scan reviewed with calcified granulomas.  This has remained stable compared to old CT images from 2016 and are likely benign.  Plan/Recommendations: - Continue albuterol inhaler  Marshell Garfinkel MD [facility] Pager [phone] 02/21/2018, 10:48 AM  CC: Leeroy Cha,*

## 2018-02-24 ENCOUNTER — Telehealth: Payer: Self-pay | Admitting: Pulmonary Disease

## 2018-02-24 MED ORDER — ALBUTEROL SULFATE HFA 108 (90 BASE) MCG/ACT IN AERS: 2.0000 | INHALATION_SPRAY | Freq: Four times a day (QID) | RESPIRATORY_TRACT | 1 refills | Status: DC | PRN

## 2018-02-24 NOTE — Telephone Encounter (Addendum)
Called and spoke to pt.  Pt had questions regarding Rx for ventolin.  I have answered all questions.  Pt is aware that Rx for Ventolin has been sent in as a 90 day supply.  Pt voiced her understanding. Nothing further is needed.

## 2018-02-24 NOTE — Telephone Encounter (Signed)
Rx for albuterol has been sent to preferred pharmacy. Pt is aware and voiced her understanding. Nothing further is needed.

## 2018-02-24 NOTE — Telephone Encounter (Signed)
Patient called back and stated she needed to speak to someone before rx was sent to pharmacy.. CB is [phone]

## 2018-03-04 ENCOUNTER — Ambulatory Visit (INDEPENDENT_AMBULATORY_CARE_PROVIDER_SITE_OTHER): Payer: Medicare Other | Admitting: Vascular Surgery

## 2018-03-04 ENCOUNTER — Encounter (INDEPENDENT_AMBULATORY_CARE_PROVIDER_SITE_OTHER): Payer: Self-pay | Admitting: Vascular Surgery

## 2018-03-04 VITALS — BP 132/69 | HR 84 | Resp 15 | Ht 65.0 in | Wt 155.0 lb

## 2018-03-04 DIAGNOSIS — E1151 Type 2 diabetes mellitus with diabetic peripheral angiopathy without gangrene: Secondary | ICD-10-CM

## 2018-03-04 DIAGNOSIS — I739 Peripheral vascular disease, unspecified: Secondary | ICD-10-CM

## 2018-03-04 DIAGNOSIS — E785 Hyperlipidemia, unspecified: Secondary | ICD-10-CM | POA: Diagnosis not present

## 2018-03-04 NOTE — Progress Notes (Signed)
Subjective:    Patient ID: Deborah Waller, female    DOB: 07-11-1941, 77 y.o.   MRN: [id number] Chief Complaint  Patient presents with  . Follow-up    Discuss U/S results from 02/11/18   Patient presents for yearly peripheral artery disease follow-up.  The patient presents without complaint today. The patient denies any claudication like symptoms, rest pain or ulcers to her feet / toes.  The patient underwent a bilateral ABI which was notable for bilateral triphasic tibials and normal arterial blood flow.  The patient underwent a bilateral arterial duplex which was notable for triphasic blood flow through her bilateral bypasses.  When compared to an ABI and a bilateral arterial duplex approximately 1 year ago this is stable.  The patient denies any fever, nausea or vomiting.  Review of Systems  Constitutional: Negative.   HENT: Negative.   Eyes: Negative.   Respiratory: Negative.   Cardiovascular: Negative.   Gastrointestinal: Negative.   Endocrine: Negative.   Genitourinary: Negative.   Musculoskeletal: Negative.   Skin: Negative.   Allergic/Immunologic: Negative.   Neurological: Negative.   Hematological: Negative.   Psychiatric/Behavioral: Negative.       Objective:   Physical Exam  Constitutional: She is oriented to person, place, and time. She appears well-developed and well-nourished. No distress.  HENT:  Head: Normocephalic and atraumatic.  Right Ear: External ear normal.  Left Ear: External ear normal.  Eyes: Pupils are equal, round, and reactive to light. Conjunctivae and EOM are normal.  Neck: Normal range of motion.  Cardiovascular: Normal rate, regular rhythm, normal heart sounds and intact distal pulses.  Pulses:      Carotid pulses are 2+ on the right side, and 2+ on the left side.      Dorsalis pedis pulses are 2+ on the right side, and 2+ on the left side.       Posterior tibial pulses are 2+ on the right side, and 2+ on the left side.  Pulmonary/Chest:  Effort normal and breath sounds normal.  Musculoskeletal: Normal range of motion. She exhibits no edema.  Neurological: She is alert and oriented to person, place, and time.  Skin: Skin is warm and dry. She is not diaphoretic.  Mixture less than 1 cm and greater than 1 cm varicosities noted to the bilateral lower extremity.  There is no stasis dermatitis, skin changes, cellulitis or ulcerations noted to the bilateral lower extremity.  Two well-healed bypass graft incision sites.  Psychiatric: She has a normal mood and affect. Her behavior is normal. Judgment and thought content normal.  Vitals reviewed.  BP 132/69 (BP Location: Right Arm, Patient Position: Sitting)   Pulse 84   Resp 15   Ht 5' 5"  (1.651 m)   Wt 155 lb (70.3 kg)   BMI 25.79 kg/m   Past Medical History:  Diagnosis Date  . Anxiety   . Neuropathy of both feet    Social History   Socioeconomic History  . Marital status: Married    Spouse name: Not on file  . Number of children: Not on file  . Years of education: Not on file  . Highest education level: Not on file  Occupational History  . Not on file  Social Needs  . Financial resource strain: Not on file  . Food insecurity:    Worry: Not on file    Inability: Not on file  . Transportation needs:    Medical: Not on file    Non-medical: Not on file  Tobacco Use  . Smoking status: Former Smoker    Packs/day: 1.00    Years: 33.00    Pack years: 33.00    Types: Cigarettes    Last attempt to quit: 11/10/1987    Years since quitting: 30.3  . Smokeless tobacco: Never Used  Substance and Sexual Activity  . Alcohol use: No  . Drug use: No  . Sexual activity: Not on file  Lifestyle  . Physical activity:    Days per week: Not on file    Minutes per session: Not on file  . Stress: Not on file  Relationships  . Social connections:    Talks on phone: Not on file    Gets together: Not on file    Attends religious service: Not on file    Active member of club or  organization: Not on file    Attends meetings of clubs or organizations: Not on file    Relationship status: Not on file  . Intimate partner violence:    Fear of current or ex partner: Not on file    Emotionally abused: Not on file    Physically abused: Not on file    Forced sexual activity: Not on file  Other Topics Concern  . Not on file  Social History Narrative   Married, lives with husband, has one child, and is retired.    Past Surgical History:  Procedure Laterality Date  . ABDOMINAL HYSTERECTOMY  1972  . ANKLE RECONSTRUCTION Left 12/2007  . BREAST EXCISIONAL BIOPSY Right   . BREAST LUMPECTOMY Left   . BUNIONECTOMY Left 2002, 2003  . CATARACT EXTRACTION Left 11/2001  . CATARACT EXTRACTION Right 10/2001  . GALLBLADDER SURGERY  2000  . leg bypass- left  03/04/2011  . leg bypass-right   04/22/2011  . LIPOMA EXCISION  07/18/2014  . OOPHORECTOMY  1995  . REPLACEMENT TOTAL KNEE Right 12/08/2011  . TOE SURGERY Left 2010  . TOTAL HIP ARTHROPLASTY Right 11/13/2008  . TOTAL HIP ARTHROPLASTY Left 06/2005   Family History  Problem Relation Age of Onset  . Allergies Sister    Allergies  Allergen Reactions  . Oxycodone     Childhood reaction, does not remember the reaction.       Assessment & Plan:  Patient presents for yearly peripheral artery disease follow-up.  The patient presents without complaint today. The patient denies any claudication like symptoms, rest pain or ulcers to her feet / toes.  The patient underwent a bilateral ABI which was notable for bilateral triphasic tibials and normal arterial blood flow.  The patient underwent a bilateral arterial duplex which was notable for triphasic blood flow through her bilateral bypasses.  When compared to an ABI and a bilateral arterial duplex approximately 1 year ago this is stable.  The patient denies any fever, nausea or vomiting.  1. PVD (peripheral vascular disease) ([place]) - Stable The patient presents for yearly  peripheral artery disease follow-up. The patient is status post lower semi-bypass x2 ABI and bilateral serial duplex with normal triphasic blood flow.  This is stable when compared to the previous exam which was completed approximately 1 year ago The patient presents without complaint or symptoms today Physical exam is unremarkable Patient is to follow-up in 1 year with an ABI and bilateral lower extremity arterial duplex I have discussed with the patient at length the risk factors for and pathogenesis of atherosclerotic disease and encouraged a healthy diet, regular exercise regimen and blood pressure / glucose control.  The patient was encouraged to call the office in the interim if he experiences any claudication like symptoms, rest pain or ulcers to his feet / toes.  - VAS [place] ABI WITH/WO TBI; Future - VAS [place] LOWER EXTREMITY ARTERIAL DUPLEX; Future  2. Type 2 diabetes mellitus with diabetic peripheral angiopathy without gangrene, unspecified whether long term insulin use (HCC) - Stable Encouraged good control as its slows the progression of atherosclerotic disease  3. Hyperlipidemia, unspecified hyperlipidemia type - Stable Encouraged good control as its slows the progression of atherosclerotic disease  Current Outpatient Medications on File Prior to Visit  Medication Sig Dispense Refill  . albuterol (PROVENTIL HFA;VENTOLIN HFA) 108 (90 Base) MCG/ACT inhaler Inhale 2 puffs into the lungs every 6 (six) hours as needed for wheezing or shortness of breath. 3 Inhaler 1  . amitriptyline (ELAVIL) 25 MG tablet TK 1 T PO HS  3  . aspirin EC 81 MG tablet Take 81 mg by mouth daily.    . Blood Glucose Monitoring Suppl (ACCU-CHEK AVIVA PLUS) w/Device KIT USE SEVERAL TIMES A WEEK UTD  1  . Cholecalciferol (VITAMIN D) 2000 units CAPS Take 2,000 Units by mouth daily.    Marland Kitchen dicyclomine (BENTYL) 20 MG tablet TK 1 T PO QID PRN  3  . hydrochlorothiazide (HYDRODIURIL) 25 MG tablet hydrochlorothiazide 25 mg  tablet    . linaclotide (LINZESS) 145 MCG CAPS capsule Take 145 mcg by mouth daily before breakfast.    . LORazepam (ATIVAN) 2 MG tablet 1 tab 1 1/2 hours before the procedure    . ondansetron (ZOFRAN) 4 MG tablet ondansetron HCl 4 mg tablet    . ondansetron (ZOFRAN-ODT) 4 MG disintegrating tablet PLACE 1 T ON THE TONGUE AND ALLOW TO DISSOLVE Q 6 H PRN  0  . pregabalin (LYRICA) 150 MG capsule Take 150 mg by mouth 2 (two) times daily.    . rosuvastatin (CRESTOR) 5 MG tablet Take 5 mg by mouth daily.    . sertraline (ZOLOFT) 50 MG tablet TK 1 T PO ONCE D  0  . simvastatin (ZOCOR) 80 MG tablet simvastatin 80 mg tablet    . tiotropium (SPIRIVA HANDIHALER) 18 MCG inhalation capsule Spiriva with HandiHaler 18 mcg and inhalation capsules    . vitamin B-12 (CYANOCOBALAMIN) 1000 MCG tablet Take 1,000 mcg by mouth daily.    Marland Kitchen vortioxetine HBr (TRINTELLIX) 5 MG TABS tablet Take 5 mg by mouth daily.     No current facility-administered medications on file prior to visit.    There are no Patient Instructions on file for this visit. No follow-ups on file.  Wilson Sample A Kareema Keitt, PA-C

## 2018-04-05 DIAGNOSIS — E785 Hyperlipidemia, unspecified: Secondary | ICD-10-CM | POA: Diagnosis not present

## 2018-04-05 DIAGNOSIS — E559 Vitamin D deficiency, unspecified: Secondary | ICD-10-CM | POA: Diagnosis not present

## 2018-04-05 DIAGNOSIS — E1169 Type 2 diabetes mellitus with other specified complication: Secondary | ICD-10-CM | POA: Diagnosis not present

## 2018-04-05 DIAGNOSIS — E538 Deficiency of other specified B group vitamins: Secondary | ICD-10-CM | POA: Diagnosis not present

## 2018-04-05 DIAGNOSIS — I739 Peripheral vascular disease, unspecified: Secondary | ICD-10-CM | POA: Diagnosis not present

## 2018-04-05 DIAGNOSIS — J449 Chronic obstructive pulmonary disease, unspecified: Secondary | ICD-10-CM | POA: Diagnosis not present

## 2018-04-05 DIAGNOSIS — Z0000 Encounter for general adult medical examination without abnormal findings: Secondary | ICD-10-CM | POA: Diagnosis not present

## 2018-04-05 DIAGNOSIS — K581 Irritable bowel syndrome with constipation: Secondary | ICD-10-CM | POA: Diagnosis not present

## 2018-04-05 DIAGNOSIS — F419 Anxiety disorder, unspecified: Secondary | ICD-10-CM | POA: Diagnosis not present

## 2018-04-05 DIAGNOSIS — G629 Polyneuropathy, unspecified: Secondary | ICD-10-CM | POA: Diagnosis not present

## 2018-04-05 DIAGNOSIS — Z1389 Encounter for screening for other disorder: Secondary | ICD-10-CM | POA: Diagnosis not present

## 2018-06-22 DIAGNOSIS — K589 Irritable bowel syndrome without diarrhea: Secondary | ICD-10-CM | POA: Diagnosis not present

## 2018-08-22 ENCOUNTER — Encounter: Payer: Self-pay | Admitting: Pulmonary Disease

## 2018-08-22 ENCOUNTER — Ambulatory Visit (INDEPENDENT_AMBULATORY_CARE_PROVIDER_SITE_OTHER): Payer: Medicare Other | Admitting: Pulmonary Disease

## 2018-08-22 ENCOUNTER — Ambulatory Visit (INDEPENDENT_AMBULATORY_CARE_PROVIDER_SITE_OTHER)
Admission: RE | Admit: 2018-08-22 | Discharge: 2018-08-22 | Disposition: A | Discharge status: Home / Self Care | Payer: Medicare Other | Source: Ambulatory Visit | Attending: Pulmonary Disease | Admitting: Pulmonary Disease

## 2018-08-22 VITALS — BP 128/78 | HR 86 | Ht 65.0 in | Wt 155.8 lb

## 2018-08-22 DIAGNOSIS — R918 Other nonspecific abnormal finding of lung field: Secondary | ICD-10-CM

## 2018-08-22 DIAGNOSIS — J438 Other emphysema: Secondary | ICD-10-CM

## 2018-08-22 DIAGNOSIS — J439 Emphysema, unspecified: Secondary | ICD-10-CM | POA: Diagnosis not present

## 2018-08-22 NOTE — Progress Notes (Signed)
Deborah Waller    [id number]    24-Dec-1940  Primary Care Physician:Sun, Gari Crown, MD  Referring Physician: Donald Prose, MD [place], [place] [address]  Chief complaint:   Follow-up for COPD   HPI: Deborah Waller is a 77 year old with past medical history of pulmonary nodules, mild COPD. She was previously followed by Dr. Barnet Pall, pulmonologist at [place], [place]. She moved here earlier this year to be closer to family. She is being followed for 3 subcentimeter pulmonary nodules with calcification suggestive of granulomas. She also has mild obstruction on her PFTs (I don't have the full study to review] and is maintained on Spiriva. She reports that the Spiriva is not working as well anymore. She reports worsening symptoms of dyspnea during the summer months when she is is out doors for golfing. She also reports chronic dyspnea on exertion on a daily basis that has been gradually worsening. She was previously on albuterol rescue inhaler but does not using it anymore. She denies any cough, sputum production, dyspnea at rest, wheezing, fevers, chills.  She is a 33 pack year smoking history. Quit in 1989. She was evaluated by sleep study in 2016. This was negative as per her pulmonologist records. Followed by Dr. Acie Fredrickson GI for irritable bowel syndrome and GERD.  Interim history:   Not on any controller medication She has albuterol which she is using very rarely States that breathing is stable.  No dyspnea, cough, sputum production.  Outpatient Encounter Medications as of 08/22/2018  Medication Sig  . albuterol (PROVENTIL HFA;VENTOLIN HFA) 108 (90 Base) MCG/ACT inhaler Inhale 2 puffs into the lungs every 6 (six) hours as needed for wheezing or shortness of breath.  . Blood Glucose Monitoring Suppl (ACCU-CHEK AVIVA PLUS) w/Device KIT USE SEVERAL TIMES A WEEK UTD  . Cholecalciferol (VITAMIN D) 2000 units CAPS Take 2,000 Units by mouth daily.  Marland Kitchen dicyclomine  (BENTYL) 20 MG tablet TK 1 T PO QID PRN  . LORazepam (ATIVAN) 2 MG tablet 1 tab 1 1/2 hours before the procedure  . ondansetron (ZOFRAN-ODT) 4 MG disintegrating tablet PLACE 1 T ON THE TONGUE AND ALLOW TO DISSOLVE Q 6 H PRN  . pregabalin (LYRICA) 150 MG capsule Take 150 mg by mouth 2 (two) times daily.  . rosuvastatin (CRESTOR) 5 MG tablet Take 5 mg by mouth daily.  . vitamin B-12 (CYANOCOBALAMIN) 1000 MCG tablet Take 1,000 mcg by mouth daily.  Marland Kitchen vortioxetine HBr (TRINTELLIX) 5 MG TABS tablet Take 5 mg by mouth daily.  . [DISCONTINUED] amitriptyline (ELAVIL) 25 MG tablet TK 1 T PO HS  . [DISCONTINUED] aspirin EC 81 MG tablet Take 81 mg by mouth daily.  . [DISCONTINUED] hydrochlorothiazide (HYDRODIURIL) 25 MG tablet hydrochlorothiazide 25 mg tablet  . [DISCONTINUED] linaclotide (LINZESS) 145 MCG CAPS capsule Take 145 mcg by mouth daily before breakfast.  . [DISCONTINUED] ondansetron (ZOFRAN) 4 MG tablet ondansetron HCl 4 mg tablet  . [DISCONTINUED] sertraline (ZOLOFT) 50 MG tablet TK 1 T PO ONCE D  . [DISCONTINUED] simvastatin (ZOCOR) 80 MG tablet simvastatin 80 mg tablet  . [DISCONTINUED] tiotropium (SPIRIVA HANDIHALER) 18 MCG inhalation capsule Spiriva with HandiHaler 18 mcg and inhalation capsules   No facility-administered encounter medications on file as of 08/22/2018.    Physical Exam: Blood pressure 128/78, pulse 86, height _0  (1.651 m), weight 155 lb 12.8 oz (70.7 kg), SpO2 97 %. Gen:      No acute distress HEENT:  EOMI,  sclera anicteric Neck:     No masses; no thyromegaly Lungs:    Clear to auscultation bilaterally; normal respiratory effort CV:         Regular rate and rhythm; no murmurs Abd:      + bowel sounds; soft, non-tender; no palpable masses, no distension Ext:    No edema; adequate peripheral perfusion Skin:      Warm and dry; no rash Neuro: alert and oriented x 3 Psych: normal mood and affect  Data Reviewed: Imaging Data from previous pulmonologist reviewed  including images (except CT scan from 2015) CT scan  07/30/14-. Small lung nodules CT scan 02/07/15- Small pulmonary nodule stable. CT scan 07/18/15-3 to 4 mm pulmonary nodule in the right upper lobe. 2-3 mm calcified nodule in the right lower lobe. 2-3 mm calcified granuloma in the left lower lobe. Subsegmental atelectasis in the right lower lobe. Diffuse emphysematous changes.  Data from [facility] CT scan from here 02/15/17-4 mm right middle lobe nodule, 3 mm right lower lobe nodule, 2 mm right lower lobe nodule, 1-2 mm right lower lobe nodule, 1-2 mm left lower lobe nodule.   CT abdomen pelvis 12/25/17-stable lower lobe calcified nodules.  Lung images are otherwise normal Reviewed the images personally.  PFTs PFT 2016 -mild airflow obstruction, moderate diffusion abnormality as per pulmonologist note. Actual study not sent.  PFTs 02/22/17 FVC 2.62 [89%), FEV1 1.94 (88%), F/F 74, TLC 70%, DLCO 54% Mild obstruction with bronchodilator response, mild restriction with moderate diffusion defect.  Assessment:  Mild emphysema, COPD Stable on albuterol as needed.  No need for controller medication as she is asymptomatic.  Pulmonary nodules CT scan reviewed with calcified granulomas.  This has remained stable compared to old CT images from 2016 and are likely benign.  Will order follow-up CT without contrast for 2020.  If the nodules continue to be stable then will stop imaging  Plan/Recommendations: - Continue albuterol inhaler  Marshell Garfinkel MD [facility] 08/22/2018, 9:22 AM  CC: Donald Prose, MD

## 2018-08-22 NOTE — Patient Instructions (Addendum)
I am glad you are doing well with the breathing Continue using inhalers as needed We will get a chest x-ray today  For the lung nodule will order a follow-up CT without contrast in June 2020 Follow-up in clinic after CT scan for review.

## 2018-10-04 ENCOUNTER — Telehealth: Payer: Self-pay | Admitting: Pulmonary Disease

## 2018-10-04 DIAGNOSIS — J449 Chronic obstructive pulmonary disease, unspecified: Secondary | ICD-10-CM | POA: Diagnosis not present

## 2018-10-04 DIAGNOSIS — E785 Hyperlipidemia, unspecified: Secondary | ICD-10-CM | POA: Diagnosis not present

## 2018-10-04 DIAGNOSIS — G629 Polyneuropathy, unspecified: Secondary | ICD-10-CM | POA: Diagnosis not present

## 2018-10-04 DIAGNOSIS — E1169 Type 2 diabetes mellitus with other specified complication: Secondary | ICD-10-CM | POA: Diagnosis not present

## 2018-10-04 DIAGNOSIS — R030 Elevated blood-pressure reading, without diagnosis of hypertension: Secondary | ICD-10-CM | POA: Diagnosis not present

## 2018-10-04 DIAGNOSIS — F419 Anxiety disorder, unspecified: Secondary | ICD-10-CM | POA: Diagnosis not present

## 2018-10-04 MED ORDER — ALBUTEROL SULFATE HFA 108 (90 BASE) MCG/ACT IN AERS: 2.0000 | INHALATION_SPRAY | Freq: Four times a day (QID) | RESPIRATORY_TRACT | 1 refills | Status: DC | PRN

## 2018-10-04 NOTE — Telephone Encounter (Signed)
Spoke with pt. Per her last OV note, Dr. Vaughan Browner wanted her to take Proventil as needed, there is no mention about Symbicort. Symbicort is not listed on her active medication list. Rx for Proventil has been sent in. Nothing further was needed.

## 2018-12-12 ENCOUNTER — Other Ambulatory Visit: Payer: Self-pay | Admitting: Family Medicine

## 2018-12-12 DIAGNOSIS — Z1231 Encounter for screening mammogram for malignant neoplasm of breast: Secondary | ICD-10-CM

## 2019-01-18 DIAGNOSIS — K589 Irritable bowel syndrome without diarrhea: Secondary | ICD-10-CM | POA: Diagnosis not present

## 2019-01-23 ENCOUNTER — Other Ambulatory Visit: Payer: Self-pay

## 2019-01-23 ENCOUNTER — Ambulatory Visit
Admission: RE | Admit: 2019-01-23 | Discharge: 2019-01-23 | Disposition: A | Discharge status: Home / Self Care | Payer: Medicare Other | Source: Ambulatory Visit | Attending: Family Medicine | Admitting: Family Medicine

## 2019-01-23 DIAGNOSIS — Z1231 Encounter for screening mammogram for malignant neoplasm of breast: Secondary | ICD-10-CM

## 2019-03-10 ENCOUNTER — Encounter (INDEPENDENT_AMBULATORY_CARE_PROVIDER_SITE_OTHER): Payer: Medicare Other

## 2019-03-10 ENCOUNTER — Ambulatory Visit (INDEPENDENT_AMBULATORY_CARE_PROVIDER_SITE_OTHER): Payer: Medicare Other | Admitting: Vascular Surgery

## 2019-03-24 ENCOUNTER — Ambulatory Visit (INDEPENDENT_AMBULATORY_CARE_PROVIDER_SITE_OTHER): Payer: Medicare Other | Admitting: Pulmonary Disease

## 2019-03-24 ENCOUNTER — Ambulatory Visit (INDEPENDENT_AMBULATORY_CARE_PROVIDER_SITE_OTHER): Payer: Medicare Other

## 2019-03-24 ENCOUNTER — Other Ambulatory Visit: Payer: Self-pay

## 2019-03-24 ENCOUNTER — Encounter: Payer: Self-pay | Admitting: Pulmonary Disease

## 2019-03-24 VITALS — BP 126/62 | HR 75 | Ht 65.0 in | Wt 150.0 lb

## 2019-03-24 DIAGNOSIS — R0602 Shortness of breath: Secondary | ICD-10-CM

## 2019-03-24 DIAGNOSIS — R911 Solitary pulmonary nodule: Secondary | ICD-10-CM | POA: Diagnosis not present

## 2019-03-24 LAB — CBC WITH DIFFERENTIAL/PLATELET
Basophils Absolute: 0 10*3/uL (ref 0.0–0.1)
Basophils Relative: 0.6 % (ref 0.0–3.0)
Eosinophils Absolute: 0.2 10*3/uL (ref 0.0–0.7)
Eosinophils Relative: 2 % (ref 0.0–5.0)
HCT: 42.4 % (ref 36.0–46.0)
Hemoglobin: 14.5 g/dL (ref 12.0–15.0)
Lymphocytes Relative: 43.9 % (ref 12.0–46.0)
Lymphs Abs: 3.9 10*3/uL (ref 0.7–4.0)
MCHC: 34.1 g/dL (ref 30.0–36.0)
MCV: 86.6 fl (ref 78.0–100.0)
Monocytes Absolute: 0.8 10*3/uL (ref 0.1–1.0)
Monocytes Relative: 8.5 % (ref 3.0–12.0)
Neutro Abs: 4 10*3/uL (ref 1.4–7.7)
Neutrophils Relative %: 45 % (ref 43.0–77.0)
Platelets: 287 10*3/uL (ref 150.0–400.0)
RBC: 4.9 Mil/uL (ref 3.87–5.11)
RDW: 13.8 % (ref 11.5–15.5)
WBC: 8.9 10*3/uL (ref 4.0–10.5)

## 2019-03-24 MED ORDER — FLUTICASONE FUROATE-VILANTEROL 100-25 MCG/INH IN AEPB: 1.0000 | INHALATION_SPRAY | Freq: Every day | RESPIRATORY_TRACT | 0 refills | Status: DC

## 2019-03-24 NOTE — Progress Notes (Signed)
Deborah [facility]er [facility]    [id number]    1941/02/21  Primary Care Physician:Sun, Gari Crown, MD  Referring Physician: Donald Prose, MD [address] [place], [place] [address]  Chief complaint:   Follow-up for COPD   HPI: Deborah Waller is a 78 year old with past medical history of pulmonary nodules, mild COPD. She was previously followed by Deborah Waller, pulmonologist at [place], [place]. She moved here earlier this year to be closer to family. She is being followed for 3 subcentimeter pulmonary nodules with calcification suggestive of granulomas. She also has mild obstruction on her PFTs (I don't have the full study to review] and is maintained on Spiriva. She reports that the Spiriva is not working as well anymore. She reports worsening symptoms of dyspnea during the summer months when she is is out doors for golfing. She also reports chronic dyspnea on exertion on a daily basis that has been gradually worsening. She was previously on albuterol rescue inhaler but does not using it anymore. She denies any cough, sputum production, dyspnea at rest, wheezing, fevers, chills.  She is a 33 pack year smoking history. Quit in 1989. She was evaluated by sleep study in 2016. This was negative as per her pulmonologist records. Followed by [facility] for irritable bowel syndrome and GERD.  Interim history:   Complains of increasing dyspnea over the past few days.  She reports increasing anxiety attacks of claustrophobia.  Cannot wear a mask without anxiety  Reports that she gets short of breath when she takes a bath and specifically when she raises her arms and washes her hair.  She does not have any difficulty with walking or other forms of exertion.  Denies any cough, wheezing, sputum production, fevers, chills  Outpatient Encounter Medications as of 03/24/2019  Medication Sig  . albuterol (PROVENTIL HFA;VENTOLIN HFA) 108 (90 Base) MCG/ACT inhaler Inhale 2 puffs into the lungs  every 6 (six) hours as needed for wheezing or shortness of breath.  . Blood Glucose Monitoring Suppl (ACCU-CHEK AVIVA PLUS) w/Device KIT USE SEVERAL TIMES A WEEK UTD  . Cholecalciferol (VITAMIN D) 2000 units CAPS Take 2,000 Units by mouth daily.  Marland Kitchen dicyclomine (BENTYL) 20 MG tablet TK 1 T PO QID PRN  . LORazepam (ATIVAN) 2 MG tablet 1 tab 1 1/2 hours before the procedure  . ondansetron (ZOFRAN-ODT) 4 MG disintegrating tablet PLACE 1 T ON THE TONGUE AND ALLOW TO DISSOLVE Q 6 H PRN  . pregabalin (LYRICA) 150 MG capsule Take 150 mg by mouth 2 (two) times daily.  . vitamin B-12 (CYANOCOBALAMIN) 1000 MCG tablet Take 1,000 mcg by mouth daily.  Marland Kitchen vortioxetine HBr (TRINTELLIX) 5 MG TABS tablet Take 5 mg by mouth daily.  . [DISCONTINUED] rosuvastatin (CRESTOR) 5 MG tablet Take 5 mg by mouth daily.   No facility-administered encounter medications on file as of 03/24/2019.    Physical Exam: Blood pressure 126/62, pulse 75, height _0  (1.651 m), weight 150 lb (68 kg), SpO2 95 %. Gen:      No acute distress HEENT:  EOMI, sclera anicteric Neck:     No masses; no thyromegaly Lungs:    Clear to auscultation bilaterally; normal respiratory effort CV:         Regular rate and rhythm; no murmurs Abd:      + bowel sounds; soft, non-tender; no palpable masses, no distension Ext:    No edema; adequate peripheral perfusion Skin:  Warm and dry; no rash Neuro: alert and oriented x 3 Psych: normal mood and affect  Data Reviewed: Imaging Data from previous pulmonologist reviewed including images (except CT scan from 2015) CT scan  07/30/14-. Small lung nodules CT scan 02/07/15- Small pulmonary nodule stable. CT scan 07/18/15-3 to 4 mm pulmonary nodule in the right upper lobe. 2-3 mm calcified nodule in the right lower lobe. 2-3 mm calcified granuloma in the left lower lobe. Subsegmental atelectasis in the right lower lobe. Diffuse emphysematous changes.  Data from [facility] CT scan from here 02/15/17-4 mm  right middle lobe nodule, 3 mm right lower lobe nodule, 2 mm right lower lobe nodule, 1-2 mm right lower lobe nodule, 1-2 mm left lower lobe nodule.   CT abdomen pelvis    12/25/17-stable lower lobe calcified nodules.  Lung images are otherwise normal Reviewed the images personally.  PFTs PFT 2016 -mild airflow obstruction, moderate diffusion abnormality as per pulmonologist note. Actual study not sent.  PFTs 02/22/17 FVC 2.62 [89%), FEV1 1.94 (88%), F/F 74, TLC 70%, DLCO 54% Mild obstruction with bronchodilator response, mild restriction with moderate diffusion defect.  Assessment:  Mild emphysema, COPD She has increasing dyspnea of unclear etiology.  Her presentation is not very typical of COPD exacerbation Suspect that her anxiety and claustrophobia is playing a big role in presentation We will reassess with chest x-ray, check CBC differential, IgE and metabolic panel Give samples of Breo  She is due to discuss her anxiety with primary care soon.  Pulmonary nodules CT scan reviewed with calcified granulomas.  This has remained stable compared to old CT images from 2016 and are likely benign.  Follow-up CT chest without contrast ordered for July 2020.  If the nodules continue to be stable then will stop imaging  Plan/Recommendations: - Start Breo, continue albuterol - Chest x-ray, CBC, metabolic panel, IgE - Follow-up CT chest for lung nodules  Deborah Garfinkel MD [facility] 03/24/2019, 2:34 PM  CC: Deborah Prose, MD

## 2019-03-24 NOTE — Addendum Note (Signed)
Addended by: Suzzanne Cloud E on: 03/24/2019 03:26 PM   Modules accepted: Orders

## 2019-03-24 NOTE — Patient Instructions (Addendum)
We will check some labs today including CBC, metabolic panel, IgE Will try an inhaler called Breo Get a chest x-ray today You will need to follow-up CT without contrast for lung nodules around July 2020 Follow-up in clinic after CT chest.

## 2019-03-24 NOTE — Addendum Note (Signed)
Addended by: Vivia Ewing on: 03/24/2019 03:25 PM   Modules accepted: Orders

## 2019-03-25 LAB — COMPREHENSIVE METABOLIC PANEL
AG Ratio: 1.4 (calc) (ref 1.0–2.5)
ALT: 19 U/L (ref 6–29)
AST: 23 U/L (ref 10–35)
Albumin: 4.2 g/dL (ref 3.6–5.1)
Alkaline phosphatase (APISO): 106 U/L (ref 37–153)
BUN: 23 mg/dL (ref 7–25)
CO2: 24 mmol/L (ref 20–32)
Calcium: 10 mg/dL (ref 8.6–10.4)
Chloride: 103 mmol/L (ref 98–110)
Creat: 0.89 mg/dL (ref 0.60–0.93)
Globulin: 2.9 g/dL (calc) (ref 1.9–3.7)
Glucose, Bld: 97 mg/dL (ref 65–99)
Potassium: 4 mmol/L (ref 3.5–5.3)
Sodium: 142 mmol/L (ref 135–146)
Total Bilirubin: 0.4 mg/dL (ref 0.2–1.2)
Total Protein: 7.1 g/dL (ref 6.1–8.1)

## 2019-03-27 LAB — IGE: IgE (Immunoglobulin E), Serum: 14 kU/L (ref <=114)

## 2019-03-29 ENCOUNTER — Telehealth: Payer: Self-pay | Admitting: Pulmonary Disease

## 2019-03-29 MED ORDER — UMECLIDINIUM-VILANTEROL 62.5-25 MCG/INH IN AEPB: 1.0000 | INHALATION_SPRAY | Freq: Every day | RESPIRATORY_TRACT | 0 refills | Status: DC

## 2019-03-29 NOTE — Telephone Encounter (Signed)
Called and spoke with Patient.  Dr Matilde Bash recommendations given.  Understanding stated. Patient asked if she could try Anoro sample first, before getting a prescription.  Anoro sample placed at foyer for Patient to pick up. Patient instructed to call if Anoro inhaler is working for her and a prescription will be sent to requested pharmacy.

## 2019-03-29 NOTE — Telephone Encounter (Signed)
Called and spoke with patient. CXR and lab results given. Patient stated understanding. Patient stated Breo inhaler sample she was given at last OV is not working. Patient would like recommendations for new inhaler. Message routed to Dr Vaughan Browner

## 2019-03-29 NOTE — Telephone Encounter (Signed)
Lets try anoro

## 2019-04-07 DIAGNOSIS — E1169 Type 2 diabetes mellitus with other specified complication: Secondary | ICD-10-CM | POA: Diagnosis not present

## 2019-04-07 DIAGNOSIS — R0600 Dyspnea, unspecified: Secondary | ICD-10-CM | POA: Diagnosis not present

## 2019-04-07 DIAGNOSIS — E785 Hyperlipidemia, unspecified: Secondary | ICD-10-CM | POA: Diagnosis not present

## 2019-04-07 DIAGNOSIS — G629 Polyneuropathy, unspecified: Secondary | ICD-10-CM | POA: Diagnosis not present

## 2019-04-07 DIAGNOSIS — M8588 Other specified disorders of bone density and structure, other site: Secondary | ICD-10-CM | POA: Diagnosis not present

## 2019-04-07 DIAGNOSIS — F419 Anxiety disorder, unspecified: Secondary | ICD-10-CM | POA: Diagnosis not present

## 2019-04-07 DIAGNOSIS — J449 Chronic obstructive pulmonary disease, unspecified: Secondary | ICD-10-CM | POA: Diagnosis not present

## 2019-04-07 DIAGNOSIS — Z0000 Encounter for general adult medical examination without abnormal findings: Secondary | ICD-10-CM | POA: Diagnosis not present

## 2019-04-07 DIAGNOSIS — Z1389 Encounter for screening for other disorder: Secondary | ICD-10-CM | POA: Diagnosis not present

## 2019-04-13 ENCOUNTER — Telehealth: Payer: Self-pay | Admitting: Pulmonary Disease

## 2019-04-13 NOTE — Telephone Encounter (Signed)
Call made to patient, made aware her Chest CT is not expected until 05/22/19. Made aware the scheduling department will call her closer to that time to get her scheduled. Recall placed for July 13th following Chest CT. Voiced understanding. Nothing further is needed at this time.

## 2019-04-21 ENCOUNTER — Other Ambulatory Visit: Payer: Self-pay | Admitting: Family Medicine

## 2019-04-21 DIAGNOSIS — M8580 Other specified disorders of bone density and structure, unspecified site: Secondary | ICD-10-CM

## 2019-04-27 ENCOUNTER — Telehealth (INDEPENDENT_AMBULATORY_CARE_PROVIDER_SITE_OTHER): Payer: Medicare Other | Admitting: Cardiology

## 2019-04-27 ENCOUNTER — Encounter: Payer: Self-pay | Admitting: Cardiology

## 2019-04-27 VITALS — BP 121/63 | HR 71 | Ht 65.0 in | Wt 150.0 lb

## 2019-04-27 DIAGNOSIS — J449 Chronic obstructive pulmonary disease, unspecified: Secondary | ICD-10-CM

## 2019-04-27 DIAGNOSIS — R0609 Other forms of dyspnea: Secondary | ICD-10-CM

## 2019-04-27 DIAGNOSIS — I739 Peripheral vascular disease, unspecified: Secondary | ICD-10-CM | POA: Diagnosis not present

## 2019-04-27 DIAGNOSIS — R0600 Dyspnea, unspecified: Secondary | ICD-10-CM

## 2019-04-27 DIAGNOSIS — Z7189 Other specified counseling: Secondary | ICD-10-CM

## 2019-04-27 DIAGNOSIS — E782 Mixed hyperlipidemia: Secondary | ICD-10-CM

## 2019-04-27 MED ORDER — ASPIRIN EC 81 MG PO TBEC: 81.0000 mg | DELAYED_RELEASE_TABLET | Freq: Every day | ORAL | 3 refills | Status: AC

## 2019-04-27 NOTE — Progress Notes (Signed)
Virtual Visit via Telephone Note   This visit type was conducted due to national recommendations for restrictions regarding the COVID-19 Pandemic (e.g. social distancing) in an effort to limit this patient's exposure and mitigate transmission in our community.  Due to her co-morbid illnesses, this patient is at least at moderate risk for complications without adequate follow up.  This format is felt to be most appropriate for this patient at this time.  The patient did not have access to video technology/had technical difficulties with video requiring transitioning to audio format only (telephone).  All issues noted in this document were discussed and addressed.  No physical exam could be performed with this format.  Please refer to the patient's chart for her  consent to telehealth for [facility].   Date:  04/27/2019   ID:  Caleb Popp Corado, DOB September 14, 1941, MRN [id number]  Patient Location: Home Provider Location: Home  PCP:  Donald Prose, MD  Cardiologist:  Buford Dresser, MD  Electrophysiologist:  None   Evaluation Performed:  New Patient Evaluation  Chief Complaint:  Dyspnea on exertion  History of Present Illness:    Deborah Waller (Pronouced ko-TONE) is a 78 y.o. female with PMH mild COPD, pulmondary nodules (followed by Dr. Vaughan Browner), PAD with prior bilateral bypass surgery followed by Dr. Lucky Cowboy, hyperlipidemia, reported hypertension but on no meds, former tobacco abuse, claustrophobia/anxiety, neuropathy. (Denies history of type II diabetes, last A1c 5.9)  The patient does not have symptoms concerning for COVID-19 infection (fever, chills, cough, or new shortness of breath).   Noticed that she feels very short of breath with washing hair in the shower, has to lie down for 15 minutes after. Similar when she uses vacuum. Anything that uses upper extremities seems to make her tired. Also gets a pain in her back, right under shoulderblades, dull. Lasts several minutes  then goes away. Not new, has had for a long time. Sometimes related to exertion, like pushing a grocery cart, but sometimes not. Not positional. No associated symptoms of nausea, diaphoresis, lightheadedness. Gets a massage every two weeks which makes the pain better.  Saw Dr. Nancy Fetter, recommended to see cardiology for her symptoms. I unfortunately do not have access to Dr. Lynnda Child notes today, but I did review Dr. Matilde Bash.   She reports that sometimes she has a hard time breathing even just sitting still/lying down, sometimes has to hold her chest to get in good breaths.   In terms of cardiac risk, was taking a baby aspirin, but stopped after discussion with Dr. Nancy Fetter about 1.5 years ago. Was just changed to atorvastatin a few weeks ago. Was on simvastatin, wanted to trial off statin, went to zetia but lipids went up (these are most recent numbers). Started atorvastatin at that time, has repeat labs scheduled with Dr. Nancy Fetter.  Lost 20 lbs on dicylomine/zofran in Dec/Jan, has had little appetite since then, weight has stabilized since.   Denies frontal chest pain, shortness of breath at rest. No PND, orthopnea, changes in LE edema (chronic swelling L ankle) or unexpected weight gain. No syncope. Sometimes feels fast HR with exertion.  No intentional exercise as she gets short of breath with walking since the start of the coronavirus pandemic.  Mother had Crohn's disease, went in for surgery but had a stroke during an operation. No other family history of heart attack or stroke.  Past Medical History:  Diagnosis Date  . Anxiety   . Neuropathy of both feet    Past Surgical  History:  Procedure Laterality Date  . ABDOMINAL HYSTERECTOMY  1972  . ANKLE RECONSTRUCTION Left 12/2007  . BREAST EXCISIONAL BIOPSY Right   . BREAST LUMPECTOMY Left   . BUNIONECTOMY Left 2002, 2003  . CATARACT EXTRACTION Left 11/2001  . CATARACT EXTRACTION Right 10/2001  . GALLBLADDER SURGERY  2000  . leg bypass- left   03/04/2011  . leg bypass-right   04/22/2011  . LIPOMA EXCISION  07/18/2014  . OOPHORECTOMY  1995  . REPLACEMENT TOTAL KNEE Right 12/08/2011  . TOE SURGERY Left 2010  . TOTAL HIP ARTHROPLASTY Right 11/13/2008  . TOTAL HIP ARTHROPLASTY Left 06/2005     Current Meds  Medication Sig  . albuterol (PROVENTIL HFA;VENTOLIN HFA) 108 (90 Base) MCG/ACT inhaler Inhale 2 puffs into the lungs every 6 (six) hours as needed for wheezing or shortness of breath.  . Blood Glucose Monitoring Suppl (ACCU-CHEK AVIVA PLUS) w/Device KIT USE SEVERAL TIMES A WEEK UTD  . Cholecalciferol (VITAMIN D) 2000 units CAPS Take 2,000 Units by mouth daily.  Marland Kitchen dicyclomine (BENTYL) 20 MG tablet TK 1 T PO QID PRN  . ondansetron (ZOFRAN-ODT) 4 MG disintegrating tablet PLACE 1 T ON THE TONGUE AND ALLOW TO DISSOLVE Q 6 H PRN  . pregabalin (LYRICA) 150 MG capsule Take 150 mg by mouth 2 (two) times daily.     Allergies:   Oxycodone   Social History   Tobacco Use  . Smoking status: Former Smoker    Packs/day: 1.00    Years: 33.00    Pack years: 33.00    Types: Cigarettes    Quit date: 11/10/1987    Years since quitting: 31.4  . Smokeless tobacco: Never Used  Substance Use Topics  . Alcohol use: No  . Drug use: No     Family Hx: The patient's family history includes Allergies in her sister.  ROS:   Please see the history of present illness.    Constitutional: Negative for chills, fever, night sweats. See HPI for unintentional weight loss  HENT: Negative for ear pain and hearing loss.   Eyes: Negative for loss of vision and eye pain.  Respiratory: Negative for cough, sputum, wheezing.   Cardiovascular: See HPI. Gastrointestinal: Negative for abdominal pain, melena, and hematochezia.  Genitourinary: Negative for dysuria and hematuria.  Musculoskeletal: Negative for falls and myalgias.  Skin: Negative for itching and rash.  Neurological: Negative for focal weakness, focal sensory changes and loss of consciousness.  Positive for occasional headaches Endo/Heme/Allergies: Does not bruise/bleed easily.  All other systems reviewed and are negative.  Labs/Other Tests and Data Reviewed:    EKG:  None available  Recent Labs: 03/24/2019: ALT 19; BUN 23; Creat 0.89; Hemoglobin 14.5; Platelets 287.0; Potassium 4.0; Sodium 142   Recent Lipid Panel No results found for: CHOL, TRIG, HDL, CHOLHDL, LDLCALC, LDLDIRECT  Wt Readings from Last 3 Encounters:  04/27/19 150 lb (68 kg)  03/24/19 150 lb (68 kg)  08/22/18 155 lb 12.8 oz (70.7 kg)    Per [facility]: Tchol 226, HDL 46, LDL 141, TG 194 A1c 5.9 Cr 0.86  Objective:    Vital Signs:  BP 121/63   Pulse 71   Ht _0  (1.651 m)   Wt 150 lb (68 kg)   BMI 24.96 kg/m    Speaking comfortably on the phone, in no acute distress  ASSESSMENT & PLAN:    Dyspnea on exertion: does have history of COPD, but with PAD is at high risk of coronary disease -we discussed extensively  how to evaluate this. Discussed echo, stress test, CT pros/cons. Cannot treadmill. Felt awful with prior lexiscan. Cr ok but getting CT scan for her lungs, wants to avoid too much radiation -we will start with echocardiogram for evaluation. If that is unrevealing, we will need to discuss either lexiscan or CT -thinks she had prior stress in [place] about 20 years, felt like she was dying with the [place] -unclear why lifting her arms triggers this. Not typical of a steal syndrome but may need to investigate further if no clear etiology found.  PAD: s/p bilateral LE bypass with Dr. Lucky Cowboy -has follow up with vascular surgery tomorrow -if in person (she believes it is), would be beneficial to check all pulses to make sure she has adequate blood flow -counseled that PAD is a form of ASCVD, and recommendations for secondary prevention include aspirin and statin. She will restart aspirin, never had any issues with it -currently on atorvastatin 20 mg (moderate intensity dose)  Hyperlipidemia: LDL  goal <70 given ASCVD -lipids 04/07/19 per [facility]: Tchol 226, HDL 46, LDL 141, TG 194 prior to restarting atorvastatin (these numbers were on ezetimibe) -recheck lipids at follow up, if not at goal will uptitrate -monitor TG as well, get fasting panel when next done  COVID-19 Education: The signs and symptoms of COVID-19 were discussed with the patient and how to seek care for testing (follow up with PCP or arrange E-visit).  The importance of social distancing was discussed today.   Time:   Today, I have spent 35 minutes with the patient with telehealth technology discussing the above problems.  However, she has multiple high risk active issues and progression of her symptoms significantly impacting her daily life. Complex decision making requiring additional testing for further evaluation.  Patient Instructions  Medication Instructions:  Start: Aspirin 81 mg daily If you need a refill on your cardiac medications before your next appointment, please call your pharmacy.   Lab work: None   Testing/Procedures: Your physician has requested that you have an echocardiogram. Echocardiography is a painless test that uses sound waves to create images of your heart. It provides your doctor with information about the size and shape of your heart and how well your heart's chambers and valves are working. This procedure takes approximately one hour. There are no restrictions for this procedure. [place] [address]   Follow-Up: At [facility], you and your health needs are our priority.  As part of our continuing mission to provide you with exceptional heart care, we have created designated Provider Care Teams.  These Care Teams include your primary Cardiologist (physician) and Advanced Practice Providers (APPs -  Physician Assistants and Nurse Practitioners) who all work together to provide you with the care you need, when you need it. You will need a follow up appointment in 2 months.   Please call our office 2 months in advance to schedule this appointment.  You may see Buford Dresser, MD or one of the following Advanced Practice Providers on your designated Care Team:   Rosaria Ferries, PA-C . Jory Sims, DNP, ANP       Medication Adjustments/Labs and Tests Ordered: Current medicines are reviewed at length with the patient today.  Concerns regarding medicines are outlined above.   Tests Ordered: Orders Placed This Encounter  Procedures  . ECHOCARDIOGRAM COMPLETE    Medication Changes: Meds ordered this encounter  Medications  . aspirin EC 81 MG tablet    Sig: Take 1  tablet (81 mg total) by mouth daily.    Dispense:  90 tablet    Refill:  3    Follow Up:  2 mos or sooner PRN.  Signed, Buford Dresser, MD  04/27/2019  [facility]

## 2019-04-27 NOTE — Patient Instructions (Signed)
Medication Instructions:  Start: Aspirin 81 mg daily If you need a refill on your cardiac medications before your next appointment, please call your pharmacy.   Lab work: None   Testing/Procedures: Your physician has requested that you have an echocardiogram. Echocardiography is a painless test that uses sound waves to create images of your heart. It provides your doctor with information about the size and shape of your heart and how well your heart's chambers and valves are working. This procedure takes approximately one hour. There are no restrictions for this procedure. [place] [address]   Follow-Up: At [facility], you and your health needs are our priority.  As part of our continuing mission to provide you with exceptional heart care, we have created designated Provider Care Teams.  These Care Teams include your primary Cardiologist (physician) and Advanced Practice Providers (APPs -  Physician Assistants and Nurse Practitioners) who all work together to provide you with the care you need, when you need it. You will need a follow up appointment in 2 months.  Please call our office 2 months in advance to schedule this appointment.  You may see Buford Dresser, MD or one of the following Advanced Practice Providers on your designated Care Team:   Rosaria Ferries, PA-C . Jory Sims, DNP, ANP

## 2019-04-28 ENCOUNTER — Encounter (INDEPENDENT_AMBULATORY_CARE_PROVIDER_SITE_OTHER): Payer: Self-pay | Admitting: Nurse Practitioner

## 2019-04-28 ENCOUNTER — Ambulatory Visit (INDEPENDENT_AMBULATORY_CARE_PROVIDER_SITE_OTHER): Payer: Medicare Other

## 2019-04-28 ENCOUNTER — Other Ambulatory Visit: Payer: Self-pay

## 2019-04-28 ENCOUNTER — Ambulatory Visit (INDEPENDENT_AMBULATORY_CARE_PROVIDER_SITE_OTHER): Payer: Medicare Other | Admitting: Nurse Practitioner

## 2019-04-28 VITALS — BP 124/76 | HR 80 | Resp 12 | Ht 65.0 in | Wt 151.0 lb

## 2019-04-28 DIAGNOSIS — I739 Peripheral vascular disease, unspecified: Secondary | ICD-10-CM

## 2019-04-28 DIAGNOSIS — Z95828 Presence of other vascular implants and grafts: Secondary | ICD-10-CM | POA: Diagnosis not present

## 2019-04-28 DIAGNOSIS — M7989 Other specified soft tissue disorders: Secondary | ICD-10-CM

## 2019-04-28 DIAGNOSIS — Z79899 Other long term (current) drug therapy: Secondary | ICD-10-CM

## 2019-04-28 DIAGNOSIS — E782 Mixed hyperlipidemia: Secondary | ICD-10-CM | POA: Diagnosis not present

## 2019-04-28 DIAGNOSIS — Z7902 Long term (current) use of antithrombotics/antiplatelets: Secondary | ICD-10-CM

## 2019-04-28 DIAGNOSIS — J449 Chronic obstructive pulmonary disease, unspecified: Secondary | ICD-10-CM

## 2019-04-28 DIAGNOSIS — Z87891 Personal history of nicotine dependence: Secondary | ICD-10-CM

## 2019-05-02 ENCOUNTER — Other Ambulatory Visit (INDEPENDENT_AMBULATORY_CARE_PROVIDER_SITE_OTHER): Payer: Self-pay | Admitting: Nurse Practitioner

## 2019-05-02 ENCOUNTER — Other Ambulatory Visit (INDEPENDENT_AMBULATORY_CARE_PROVIDER_SITE_OTHER): Payer: Medicare Other

## 2019-05-02 ENCOUNTER — Other Ambulatory Visit: Payer: Self-pay

## 2019-05-02 ENCOUNTER — Ambulatory Visit (INDEPENDENT_AMBULATORY_CARE_PROVIDER_SITE_OTHER): Payer: Medicare Other | Admitting: Nurse Practitioner

## 2019-05-02 ENCOUNTER — Encounter (INDEPENDENT_AMBULATORY_CARE_PROVIDER_SITE_OTHER): Payer: Self-pay | Admitting: Nurse Practitioner

## 2019-05-02 VITALS — BP 148/66 | HR 80 | Resp 16 | Wt 152.6 lb

## 2019-05-02 DIAGNOSIS — I739 Peripheral vascular disease, unspecified: Secondary | ICD-10-CM | POA: Diagnosis not present

## 2019-05-02 DIAGNOSIS — R609 Edema, unspecified: Secondary | ICD-10-CM

## 2019-05-02 DIAGNOSIS — Z79899 Other long term (current) drug therapy: Secondary | ICD-10-CM

## 2019-05-02 DIAGNOSIS — Z95828 Presence of other vascular implants and grafts: Secondary | ICD-10-CM

## 2019-05-02 DIAGNOSIS — E782 Mixed hyperlipidemia: Secondary | ICD-10-CM | POA: Diagnosis not present

## 2019-05-02 DIAGNOSIS — Z87891 Personal history of nicotine dependence: Secondary | ICD-10-CM | POA: Diagnosis not present

## 2019-05-02 DIAGNOSIS — R600 Localized edema: Secondary | ICD-10-CM | POA: Diagnosis not present

## 2019-05-02 DIAGNOSIS — J449 Chronic obstructive pulmonary disease, unspecified: Secondary | ICD-10-CM

## 2019-05-02 DIAGNOSIS — M7989 Other specified soft tissue disorders: Secondary | ICD-10-CM

## 2019-05-07 ENCOUNTER — Encounter (INDEPENDENT_AMBULATORY_CARE_PROVIDER_SITE_OTHER): Payer: Self-pay | Admitting: Nurse Practitioner

## 2019-05-07 NOTE — Progress Notes (Signed)
SUBJECTIVE:  Patient ID: Deborah Waller, female    DOB: [birth year], 78 y.o.   MRN: [id number] Chief Complaint  Patient presents with  . Follow-up    HPI  Deborah Waller is a 78 y.o. female  The patient returns to the office for followup and review of the noninvasive studies. There have been no interval changes in lower extremity symptoms. No interval shortening of the patient's claudication distance or development of rest pain symptoms. No new ulcers or wounds have occurred since the last visit.  There have been no significant changes to the patient's overall health care.  The patient denies amaurosis fugax or recent TIA symptoms. There are no recent neurological changes noted. The patient denies history of DVT, PE or superficial thrombophlebitis. The patient denies recent episodes of angina or shortness of breath.   ABI Rt=1.12 and Lt=1.03  (previous ABI's Rt=1.25 and Lt=1.16) Duplex ultrasound of the bilateral lower extremities reveal patent fem pop bypass grafts with no evidence of stenosis.  Her left distal PTA is occluded with collateral flow.    Past Medical History:  Diagnosis Date  . Anxiety   . Neuropathy of both feet     Past Surgical History:  Procedure Laterality Date  . ABDOMINAL HYSTERECTOMY  1972  . ANKLE RECONSTRUCTION Left 12/2007  . BREAST EXCISIONAL BIOPSY Right   . BREAST LUMPECTOMY Left   . BUNIONECTOMY Left 2002, 2003  . CATARACT EXTRACTION Left 11/2001  . CATARACT EXTRACTION Right 10/2001  . GALLBLADDER SURGERY  2000  . leg bypass- left  03/04/2011  . leg bypass-right   04/22/2011  . LIPOMA EXCISION  07/18/2014  . OOPHORECTOMY  1995  . REPLACEMENT TOTAL KNEE Right 12/08/2011  . TOE SURGERY Left 2010  . TOTAL HIP ARTHROPLASTY Right 11/13/2008  . TOTAL HIP ARTHROPLASTY Left 06/2005    Social History   Socioeconomic History  . Marital status: Married    Spouse name: Not on file  . Number of children: Not on file  . Years of  education: Not on file  . Highest education level: Not on file  Occupational History  . Not on file  Social Needs  . Financial resource strain: Not on file  . Food insecurity    Worry: Not on file    Inability: Not on file  . Transportation needs    Medical: Not on file    Non-medical: Not on file  Tobacco Use  . Smoking status: Former Smoker    Packs/day: 1.00    Years: 33.00    Pack years: 33.00    Types: Cigarettes    Quit date: 11/10/1987    Years since quitting: 31.5  . Smokeless tobacco: Never Used  Substance and Sexual Activity  . Alcohol use: No  . Drug use: No  . Sexual activity: Not on file  Lifestyle  . Physical activity    Days per week: Not on file    Minutes per session: Not on file  . Stress: Not on file  Relationships  . Social Herbalist on phone: Not on file    Gets together: Not on file    Attends religious service: Not on file    Active member of club or organization: Not on file    Attends meetings of clubs or organizations: Not on file    Relationship status: Not on file  . Intimate partner violence    Fear of current or ex partner: Not on file  Emotionally abused: Not on file    Physically abused: Not on file    Forced sexual activity: Not on file  Other Topics Concern  . Not on file  Social History Narrative   Married, lives with husband, has one child, and is retired.     Family History  Problem Relation Age of Onset  . Allergies Sister     Allergies  Allergen Reactions  . Oxycodone     Childhood reaction, does not remember the reaction.      Review of Systems   Review of Systems: Negative Unless Checked Constitutional: _0 Weight loss  _1 Fever  _2 Chills Cardiac: _3 Chest pain   _4  Atrial Fibrillation  _5 Palpitations   _6 Shortness of breath when laying flat   _7 Shortness of breath with exertion. _8 Shortness of breath at rest Vascular:  _9 Pain in legs with walking   _10 Pain in legs with standing _11 Pain in legs when laying  flat   _12 Claudication    _13 Pain in feet when laying flat    _14 History of DVT   _15 Phlebitis   _16 Swelling in legs   _17 Varicose veins   _18 Non-healing ulcers Pulmonary:   _19 Uses home oxygen   _20 Productive cough   _21 Hemoptysis   _22 Wheeze  _23 COPD   _24 Asthma Neurologic:  _25 Dizziness   _26 Seizures  _27 Blackouts _28 History of stroke   _29 History of TIA  _30 Aphasia   _31 Temporary Blindness   _32 Weakness or numbness in arm   _33 Weakness or numbness in leg Musculoskeletal:   _34 Joint swelling   _35 Joint pain   _36 Low back pain  _37  History of Knee Replacement _38 Arthritis _39 back Surgeries  _40  Spinal Stenosis    Hematologic:  _41 Easy bruising  _42 Easy bleeding   _43 Hypercoagulable state   _44 Anemic Gastrointestinal:  _45 Diarrhea   _46 Vomiting  _47 Gastroesophageal reflux/heartburn   _48 Difficulty swallowing. _49 Abdominal pain Genitourinary:  _50 Chronic kidney disease   _51 Difficult urination  _52 Anuric   _53 Blood in urine _54 Frequent urination  _55 Burning with urination   _56 Hematuria Skin:  _57 Rashes   _58 Ulcers _59 Wounds Psychological:  _60 History of anxiety   _61  History of major depression  _62  Memory Difficulties      OBJECTIVE:   Physical Exam  BP 124/76 (BP Location: Left Arm, Patient Position: Sitting, Cuff Size: Large)   Pulse 80   Resp 12   Ht _63  (1.651 m)   Wt 151 lb (68.5 kg)   BMI 25.13 kg/m   Gen: WD/WN, NAD Head: [place]/AT, No temporalis wasting.  Ear/Nose/Throat: Hearing grossly intact, nares w/o erythema or drainage Eyes: PER, EOMI, sclera nonicteric.  Neck: Supple, no masses.  No JVD.  Pulmonary:  Good air movement, no use of accessory muscles.  Cardiac: RRR Vascular:  2+ edema soft Vessel Right Left  Radial Palpable Palpable  Dorsalis Pedis Palpable Palpable  Posterior Tibial Palpable Palpable   Gastrointestinal: soft, non-distended. No guarding/no peritoneal signs.  Musculoskeletal: M/S 5/5 throughout.  No deformity or atrophy.  Neurologic: Pain and light touch intact in extremities.  Symmetrical.  Speech  is fluent. Motor exam as listed above. Psychiatric: Judgment intact, Mood & affect appropriate for pt's clinical situation. Dermatologic: No Venous rashes. No Ulcers Noted.  No changes consistent with cellulitis. Lymph : No Cervical lymphadenopathy, no lichenification or skin changes of chronic lymphedema.       ASSESSMENT AND PLAN:  1. PVD (peripheral vascular disease) ([place])  Recommend:  The patient has evidence of atherosclerosis of the lower extremities with claudication.  The patient does not voice lifestyle limiting changes at this point in time.  Noninvasive  studies do not suggest clinically significant change.  No invasive studies, angiography or surgery at this time The patient should continue walking and begin a more formal exercise program.  The patient should continue antiplatelet therapy and aggressive treatment of the lipid abnormalities  No changes in the patient's medications at this time  The patient should continue wearing graduated compression socks 10-15 mmHg strength to control the mild edema.   Patient will follow up in one year with ABI and LE art duplex  2. Mixed hyperlipidemia Continue statin as ordered and reviewed, no changes at this time   3. Chronic obstructive pulmonary disease, unspecified COPD type ([place]) Continue pulmonary medications and aerosols as already ordered, these medications have been reviewed and there are no changes at this time.    4. Leg swelling The patient is complaining of lower extremity swelling.  We will arrange a bilateral lower extremity venous reflux at her convenience.     Current Outpatient Medications on File Prior to Visit  Medication Sig Dispense Refill  . aspirin EC 81 MG tablet Take 1 tablet (81 mg total) by mouth daily. 90 tablet 3  . atorvastatin (LIPITOR) 20 MG tablet Take 1 tablet by mouth daily.    . Cholecalciferol (VITAMIN D) 2000 units CAPS Take 2,000 Units by mouth daily.    Marland Kitchen dicyclomine (BENTYL) 20 MG  tablet TK 1 T PO QID PRN  3  . omeprazole (PRILOSEC) 20 MG capsule Take 1 capsule by mouth daily.    . ondansetron (ZOFRAN-ODT) 4 MG disintegrating tablet PLACE 1 T ON THE TONGUE AND ALLOW TO DISSOLVE Q 6 H PRN  0  . pregabalin (LYRICA) 150 MG capsule Take 150 mg by mouth 2 (two) times daily.    . TRINTELLIX 10 MG TABS tablet Take 1 tablet by mouth daily.    Marland Kitchen albuterol (PROVENTIL HFA;VENTOLIN HFA) 108 (90 Base) MCG/ACT inhaler Inhale 2 puffs into the lungs every 6 (six) hours as needed for wheezing or shortness of breath. (Patient not taking: Reported on 04/28/2019) 3 Inhaler 1  . Blood Glucose Monitoring Suppl (ACCU-CHEK AVIVA PLUS) w/Device KIT USE SEVERAL TIMES A WEEK UTD  1   No current facility-administered medications on file prior to visit.     There are no Patient Instructions on file for this visit. No follow-ups on file.   Kris Hartmann, NP  This note was completed with Sales executive.  Any errors are purely unintentional.

## 2019-05-09 DIAGNOSIS — M7989 Other specified soft tissue disorders: Secondary | ICD-10-CM | POA: Insufficient documentation

## 2019-05-09 NOTE — Progress Notes (Signed)
SUBJECTIVE:  Patient ID: Deborah Waller, female    DOB: 10-11-1941, 78 y.o.   MRN: [id number] Chief Complaint  Patient presents with  . Follow-up    ultrasound follow up    HPI  Deborah Waller is a 78 y.o. female that presents today with concerns of bilateral lower extremity edema as well as numbness in the bottom of.  Previous noninvasive arterial studies show adequate blood flow that would not cause numbness of her feet.  The patient does have a previous history of neuropathy however the patient feels that it has worsened somewhat.  The patient also notes that the pain in her feet actually became better once she transitioned to Lyrica.  Patient denies any fever, chills, nausea, vomiting or diarrhea.  Patient underwent noninvasive studies to examine her venous system.  There appears to be no significant reflux in the small saphenous vein.  Or the femoropopliteal deep venous system.  The patient appears to have had her bilateral great saphenous veins harvested for her femoropopliteal artery bypass graft.  No evidence of DVT bilaterally no evidence of superficial venous imposes bilaterally.  Past Medical History:  Diagnosis Date  . Anxiety   . Neuropathy of both feet     Past Surgical History:  Procedure Laterality Date  . ABDOMINAL HYSTERECTOMY  1972  . ANKLE RECONSTRUCTION Left 12/2007  . BREAST EXCISIONAL BIOPSY Right   . BREAST LUMPECTOMY Left   . BUNIONECTOMY Left 2002, 2003  . CATARACT EXTRACTION Left 11/2001  . CATARACT EXTRACTION Right 10/2001  . GALLBLADDER SURGERY  2000  . leg bypass- left  03/04/2011  . leg bypass-right   04/22/2011  . LIPOMA EXCISION  07/18/2014  . OOPHORECTOMY  1995  . REPLACEMENT TOTAL KNEE Right 12/08/2011  . TOE SURGERY Left 2010  . TOTAL HIP ARTHROPLASTY Right 11/13/2008  . TOTAL HIP ARTHROPLASTY Left 06/2005    Social History   Socioeconomic History  . Marital status: Married    Spouse name: Not on file  . Number of children:  Not on file  . Years of education: Not on file  . Highest education level: Not on file  Occupational History  . Not on file  Social Needs  . Financial resource strain: Not on file  . Food insecurity    Worry: Not on file    Inability: Not on file  . Transportation needs    Medical: Not on file    Non-medical: Not on file  Tobacco Use  . Smoking status: Former Smoker    Packs/day: 1.00    Years: 33.00    Pack years: 33.00    Types: Cigarettes    Quit date: 11/10/1987    Years since quitting: 31.5  . Smokeless tobacco: Never Used  Substance and Sexual Activity  . Alcohol use: No  . Drug use: No  . Sexual activity: Not on file  Lifestyle  . Physical activity    Days per week: Not on file    Minutes per session: Not on file  . Stress: Not on file  Relationships  . Social Herbalist on phone: Not on file    Gets together: Not on file    Attends religious service: Not on file    Active member of club or organization: Not on file    Attends meetings of clubs or organizations: Not on file    Relationship status: Not on file  . Intimate partner violence    Fear of current  or ex partner: Not on file    Emotionally abused: Not on file    Physically abused: Not on file    Forced sexual activity: Not on file  Other Topics Concern  . Not on file  Social History Narrative   Married, lives with husband, has one child, and is retired.     Family History  Problem Relation Age of Onset  . Allergies Sister     Allergies  Allergen Reactions  . Oxycodone     Childhood reaction, does not remember the reaction.      Review of Systems   Review of Systems: Negative Unless Checked Constitutional: [] Weight loss  [] Fever  [] Chills Cardiac: [] Chest pain   []  Atrial Fibrillation  [] Palpitations   [] Shortness of breath when laying flat   [] Shortness of breath with exertion. [] Shortness of breath at rest Vascular:  [] Pain in legs with walking   [] Pain in legs with standing  [] Pain in legs when laying flat   [] Claudication    [] Pain in feet when laying flat    [] History of DVT   [] Phlebitis   [x] Swelling in legs   [] Varicose veins   [] Non-healing ulcers Pulmonary:   [] Uses home oxygen   [] Productive cough   [] Hemoptysis   [] Wheeze  [x] COPD   [] Asthma Neurologic:  [] Dizziness   [] Seizures  [] Blackouts [] History of stroke   [] History of TIA  [] Aphasia   [] Temporary Blindness   [] Weakness or numbness in arm   [] Weakness or numbness in leg Musculoskeletal:   [] Joint swelling   [] Joint pain   [] Low back pain  []  History of Knee Replacement [] Arthritis [] back Surgeries  []  Spinal Stenosis    Hematologic:  [] Easy bruising  [] Easy bleeding   [] Hypercoagulable state   [] Anemic Gastrointestinal:  [] Diarrhea   [] Vomiting  [] Gastroesophageal reflux/heartburn   [] Difficulty swallowing. [] Abdominal pain Genitourinary:  [] Chronic kidney disease   [] Difficult urination  [] Anuric   [] Blood in urine [] Frequent urination  [] Burning with urination   [] Hematuria Skin:  [] Rashes   [] Ulcers [] Wounds Psychological:  [] History of anxiety   []  History of major depression  []  Memory Difficulties      OBJECTIVE:   Physical Exam  BP (!) 148/66 (BP Location: Right Arm)   Pulse 80   Resp 16   Wt 152 lb 9.6 oz (69.2 kg)   BMI 25.39 kg/m   Gen: WD/WN, NAD Head: [place]/AT, No temporalis wasting.  Ear/Nose/Throat: Hearing grossly intact, nares w/o erythema or drainage Eyes: PER, EOMI, sclera nonicteric.  Neck: Supple, no masses.  No JVD.  Pulmonary:  Good air movement, no use of accessory muscles.  Cardiac: RRR Vascular:  1+ edema bilaterally Vessel Right Left  Radial Palpable Palpable  Dorsalis Pedis Palpable Palpable  Posterior Tibial Palpable Palpable   Gastrointestinal: soft, non-distended. No guarding/no peritoneal signs.  Musculoskeletal: M/S 5/5 throughout.  No deformity or atrophy.  Neurologic: Pain and light touch intact in extremities.  Symmetrical.  Speech is fluent. Motor exam  as listed above. Psychiatric: Judgment intact, Mood & affect appropriate for pt's clinical situation. Dermatologic: No Venous rashes. No Ulcers Noted.  No changes consistent with cellulitis. Lymph : No Cervical lymphadenopathy, no lichenification or skin changes of chronic lymphedema.       ASSESSMENT AND PLAN:  1. Mixed hyperlipidemia Continue statin as ordered and reviewed, no changes at this time   2. Chronic obstructive pulmonary disease, unspecified COPD type ([place]) Continue pulmonary medications and aerosols as already ordered, these medications have been reviewed  and there are no changes at this time.    3. PVD (peripheral vascular disease) ([facility]) The numbness that the patient describes on her feet are likely due to her neuropathy.  The swelling can definitely worsen the neuropathy.  Patient is advised to implement conservative procedures as outlined below.  Patient is also urged to follow-up with her PCP in order to titrate medicines for neuropathy if necessary.  As far as patient's arterial status previous studies done a week or so ago showed no issue which would cause worsening of neuropathy.  4. Leg swelling No surgery or intervention at this point in time.  I have reviewed my discussion with the patient regarding venous insufficiency and why it causes symptoms. I have discussed with the patient the chronic skin changes that accompany venous insufficiency and the long term sequela such as ulceration. Patient will contnue wearing graduated compression stockings on a daily basis, as this has provided excellent control of his edema. The patient will put the stockings on first thing in the morning and removing them in the evening. The patient is reminded not to sleep in the stockings.  In addition, behavioral modification including elevation during the day will be initiated. Exercise is strongly encouraged.    Given the patient's good control and lack of any problems regarding the  venous insufficiency and lymphedema a lymph pump in not need at this time.  The patient will follow up with me PRN should anything change.  The patient voices agreement with this plan.    Current Outpatient Medications on File Prior to Visit  Medication Sig Dispense Refill  . aspirin EC 81 MG tablet Take 1 tablet (81 mg total) by mouth daily. 90 tablet 3  . atorvastatin (LIPITOR) 20 MG tablet Take 1 tablet by mouth daily.    . Blood Glucose Monitoring Suppl (ACCU-CHEK AVIVA PLUS) w/Device KIT USE SEVERAL TIMES A WEEK UTD  1  . Cholecalciferol (VITAMIN D) 2000 units CAPS Take 2,000 Units by mouth daily.    Marland Kitchen dicyclomine (BENTYL) 20 MG tablet TK 1 T PO QID PRN  3  . omeprazole (PRILOSEC) 20 MG capsule Take 1 capsule by mouth daily.    . ondansetron (ZOFRAN-ODT) 4 MG disintegrating tablet PLACE 1 T ON THE TONGUE AND ALLOW TO DISSOLVE Q 6 H PRN  0  . pregabalin (LYRICA) 150 MG capsule Take 150 mg by mouth 2 (two) times daily.    . TRINTELLIX 10 MG TABS tablet Take 1 tablet by mouth daily.    Marland Kitchen albuterol (PROVENTIL HFA;VENTOLIN HFA) 108 (90 Base) MCG/ACT inhaler Inhale 2 puffs into the lungs every 6 (six) hours as needed for wheezing or shortness of breath. (Patient not taking: Reported on 04/28/2019) 3 Inhaler 1   No current facility-administered medications on file prior to visit.     There are no Patient Instructions on file for this visit. No follow-ups on file.   Kris Hartmann, NP  This note was completed with Sales executive.  Any errors are purely unintentional.

## 2019-05-10 ENCOUNTER — Encounter (INDEPENDENT_AMBULATORY_CARE_PROVIDER_SITE_OTHER): Payer: Self-pay | Admitting: Nurse Practitioner

## 2019-05-17 DIAGNOSIS — L819 Disorder of pigmentation, unspecified: Secondary | ICD-10-CM | POA: Diagnosis not present

## 2019-05-17 DIAGNOSIS — L821 Other seborrheic keratosis: Secondary | ICD-10-CM | POA: Diagnosis not present

## 2019-05-19 ENCOUNTER — Telehealth: Payer: Self-pay | Admitting: Pulmonary Disease

## 2019-05-19 NOTE — Telephone Encounter (Signed)
Script Screening patients for COVID-19 and reviewing new operational procedures  Greeting - The reason I am calling is to share with you some new changes to our processes that are designed to help [place] keep everyone safe. Is now a good time to speak with you? Patient says "no' - ask them when you can call back and let them know it's important to do this prior to their appointment.  Patient says "yes" - [place], [place] the first thing I need to do is ask you some screening Questions.  1. To the best of your knowledge, have you been in close contact with any one with a confirmed diagnosis of COVID 19? o No - proceed to next question  2. Have you had any one or more of the following: fever, chills, cough, shortness of breath or any flu-like symptoms? o No - proceed to next question  3. Have you been diagnosed with or have a previous diagnosis of COVID 19? o No - proceed to next question  4. I am going to go over a few other symptoms with you. Please let me know if you are experiencing any of the following: . Ear, nose or throat discomfort . A sore throat . Headache . Muscle pain . Diarrhea . Loss of taste or smell o No - proceed to next question  Thank you for answering these questions. Please know we will ask you these questions or similar questions when you arrive for your appointment and again it's how we are keeping everyone safe. Also, to keep you safe, please use the provided hand sanitizer when you enter the building. (Insert pt name), we are asking everyone in the building to wear a mask because they help [place] prevent the spread of germs. Do you have a mask of your own, if not, we are happy to provide one for you. The last thing I want to go over with you is the no visitor guidelines. This means no one can attend the appointment with you unless you need physical assistance. I understand this may be different from your past appointments and I know this may be difficult but please  know if someone is driving you we are happy to call them for you once your appointment is over.  [INSERT [place]  (Insert pt name) I've given you a lot of information, what questions do you have about what I've talked about today or your appointment tomorrow?  Benjie Karvonen, CMA

## 2019-05-22 ENCOUNTER — Other Ambulatory Visit: Payer: Self-pay

## 2019-05-22 ENCOUNTER — Ambulatory Visit (INDEPENDENT_AMBULATORY_CARE_PROVIDER_SITE_OTHER)
Admission: RE | Admit: 2019-05-22 | Discharge: 2019-05-22 | Disposition: A | Discharge status: Home / Self Care | Payer: Medicare Other | Source: Ambulatory Visit | Attending: Pulmonary Disease | Admitting: Pulmonary Disease

## 2019-05-22 DIAGNOSIS — R911 Solitary pulmonary nodule: Secondary | ICD-10-CM

## 2019-05-22 DIAGNOSIS — R918 Other nonspecific abnormal finding of lung field: Secondary | ICD-10-CM | POA: Diagnosis not present

## 2019-05-22 DIAGNOSIS — J449 Chronic obstructive pulmonary disease, unspecified: Secondary | ICD-10-CM | POA: Diagnosis not present

## 2019-05-24 ENCOUNTER — Ambulatory Visit (INDEPENDENT_AMBULATORY_CARE_PROVIDER_SITE_OTHER): Payer: Medicare Other | Admitting: Nurse Practitioner

## 2019-05-24 ENCOUNTER — Other Ambulatory Visit: Payer: Self-pay

## 2019-05-24 ENCOUNTER — Encounter: Payer: Self-pay | Admitting: Nurse Practitioner

## 2019-05-24 DIAGNOSIS — J449 Chronic obstructive pulmonary disease, unspecified: Secondary | ICD-10-CM | POA: Diagnosis not present

## 2019-05-24 NOTE — Patient Instructions (Addendum)
Continue albuterol as needed Discussed CT in office today    Follow up: Follow up with Dr. Vaughan Browner in 3 months or sooner if needed

## 2019-05-24 NOTE — Assessment & Plan Note (Signed)
Patient presents today for follow-up visit.  She was last seen by Dr. Vaughan Browner on 03/24/2019.  She was started on Breo but states that this did not help.  She was then started on Anoro.  She states that this did not help so she quit the medication.  She states that she has been stable and her albuterol rescue inhaler does help when needed.  She states that anxiety and claustrophobia play a big role in her shortness of breath.  She did have lab work-up at last visit which was overall negative.  Patient recently had CT chest to follow-up on lung nodules which showed stable tiny calcified granulomata in both lungs and emphysema.  Patient does have an upcoming visit with cardiology scheduled cardiac work-up.  Patient does still complain of shortness of breath with exertion.  She denies any significant cough.   Patient Instructions  Continue albuterol as needed Discussed CT in office today    Follow up: Follow up with Dr. Vaughan Browner in 3 months or sooner if needed

## 2019-05-24 NOTE — Progress Notes (Signed)
@Patient  ID: Deborah Waller, female    DOB: 09-18-1941, 78 y.o.   MRN: [id number]  Chief Complaint  Patient presents with   Follow-up    Referring provider: Donald Prose, MD  HPI 78 year old female former smoker with pulmonary nodules and mild COPD who is followed by Dr. Vaughan Browner.  Tests: Imaging  CT Chest 05/22/19 - Stable tiny calcified granulomata in both lungs. No evidence of pulmonary neoplasm or other active disease within the thorax. Emphysema   Data from previous pulmonologist: CT scan  07/30/14-. Small lung nodules CT scan 02/07/15- Small pulmonary nodule stable. CT scan 07/18/15-3 to 4 mm pulmonary nodule in the right upper lobe. 2-3 mm calcified nodule in the right lower lobe. 2-3 mm calcified granuloma in the left lower lobe. Subsegmental atelectasis in the right lower lobe. Diffuse emphysematous changes.  Data from [facility] CT scan from here 02/15/17-4 mm right middle lobe nodule, 3 mm right lower lobe nodule, 2 mm right lower lobe nodule, 1-2 mm right lower lobe nodule, 1-2 mm left lower lobe nodule.   CT abdomen pelvis 12/25/17-stable lower lobe calcified nodules.  Lung images are otherwise normal  PFTs PFT 2016 -mild airflow obstruction, moderate diffusion abnormality as per pulmonologist note. Actual study not sent.  PFTs 02/22/17 FVC 2.62 [89%), FEV1 1.94 (88%), F/F 74, TLC 70%, DLCO 54% Mild obstruction with bronchodilator response, mild restriction with moderate diffusion defect.  OV 05/24/19 - follow up Patient presents today for follow-up visit.  She was last seen by Dr. Vaughan Browner on 03/24/2019.  She was started on Breo but states that this did not help.  She was then started on Anoro.  She states that this did not help so she quit the medication.  She states that she has been stable and her albuterol rescue inhaler does help when needed.  She states that anxiety and claustrophobia play a big role in her shortness of breath.  She did have lab work-up at last  visit which was overall negative.  Patient recently had CT chest to follow-up on lung nodules which showed stable tiny calcified granulomata in both lungs and emphysema.  Patient does have an upcoming visit with cardiology scheduled cardiac work-up.  Patient does still complain of shortness of breath with exertion.  She denies any significant cough. Denies f/c/s, n/v/d, hemoptysis, PND, leg swelling.      Allergies  Allergen Reactions   Oxycodone     Childhood reaction, does not remember the reaction.     Immunization History  Administered Date(s) Administered   Influenza, High Dose Seasonal PF 08/23/2018   Influenza-Unspecified 09/10/2015, 08/08/2017   Pneumococcal Conjugate-13 11/09/2014   Pneumococcal Polysaccharide-23 11/09/2012   Zoster 11/09/2012    Past Medical History:  Diagnosis Date   Anxiety    Neuropathy of both feet     Tobacco History: Social History   Tobacco Use  Smoking Status Former Smoker   Packs/day: 1.00   Years: 33.00   Pack years: 33.00   Types: Cigarettes   Quit date: 11/10/1987   Years since quitting: 31.5  Smokeless Tobacco Never Used   Counseling given: Not Answered   Outpatient Encounter Medications as of 05/24/2019  Medication Sig   albuterol (PROVENTIL HFA;VENTOLIN HFA) 108 (90 Base) MCG/ACT inhaler Inhale 2 puffs into the lungs every 6 (six) hours as needed for wheezing or shortness of breath.   aspirin EC 81 MG tablet Take 1 tablet (81 mg total) by mouth daily.   atorvastatin (LIPITOR) 20 MG tablet Take  1 tablet by mouth daily.   Blood Glucose Monitoring Suppl (ACCU-CHEK AVIVA PLUS) w/Device KIT USE SEVERAL TIMES A WEEK UTD   Cholecalciferol (VITAMIN D) 2000 units CAPS Take 2,000 Units by mouth daily.   dicyclomine (BENTYL) 20 MG tablet TK 1 T PO QID PRN   omeprazole (PRILOSEC) 20 MG capsule Take 1 capsule by mouth daily.   ondansetron (ZOFRAN-ODT) 4 MG disintegrating tablet PLACE 1 T ON THE TONGUE AND ALLOW TO  DISSOLVE Q 6 H PRN   pregabalin (LYRICA) 150 MG capsule Take 150 mg by mouth 2 (two) times daily.   TRINTELLIX 10 MG TABS tablet Take 1 tablet by mouth daily.   No facility-administered encounter medications on file as of 05/24/2019.      Review of Systems  Review of Systems  Constitutional: Negative.  Negative for chills and fever.  HENT: Negative.   Respiratory: Positive for shortness of breath. Negative for cough and wheezing.   Cardiovascular: Negative.  Negative for chest pain, palpitations and leg swelling.  Gastrointestinal: Negative.   Allergic/Immunologic: Negative.   Neurological: Negative.   Psychiatric/Behavioral: Negative.        Physical Exam  BP (!) 142/88 (BP Location: Left Arm, Cuff Size: Normal)    Pulse 63    Temp 98 F (36.7 C) (Oral)    Ht 5' 9"  (1.753 m)    Wt 151 lb 12.8 oz (68.9 kg)    SpO2 97%    BMI 22.42 kg/m   Wt Readings from Last 5 Encounters:  05/24/19 151 lb 12.8 oz (68.9 kg)  05/02/19 152 lb 9.6 oz (69.2 kg)  04/28/19 151 lb (68.5 kg)  04/27/19 150 lb (68 kg)  03/24/19 150 lb (68 kg)     Physical Exam Vitals signs and nursing note reviewed.  Constitutional:      General: She is not in acute distress.    Appearance: She is well-developed.  Cardiovascular:     Rate and Rhythm: Normal rate and regular rhythm.  Pulmonary:     Effort: Pulmonary effort is normal. No respiratory distress.     Breath sounds: Normal breath sounds. No wheezing or rhonchi.  Musculoskeletal:        General: No swelling.  Neurological:     Mental Status: She is alert and oriented to person, place, and time.     Imaging: Ct Chest Wo Contrast  Result Date: 05/22/2019 CLINICAL DATA:  Follow-up indeterminate pulmonary nodule. COPD. EXAM: CT CHEST WITHOUT CONTRAST TECHNIQUE: Multidetector CT imaging of the chest was performed following the standard protocol without IV contrast. COMPARISON:  02/15/2017 FINDINGS: Cardiovascular: No acute findings. Aortic and  coronary artery atherosclerosis. Mediastinum/Nodes: No masses or pathologically enlarged lymph nodes identified on this unenhanced exam. Lungs/Pleura: Mild moderate centrilobular emphysema again seen. Stable mild right lower lobe scarring. Tiny calcified granulomas are again seen in both lungs, but no suspicious pulmonary nodules or masses are identified. No evidence of pulmonary infiltrate or pleural effusion. Upper Abdomen:  Unremarkable. Musculoskeletal:  No suspicious bone lesions. IMPRESSION: Stable tiny calcified granulomata in both lungs. No evidence of pulmonary neoplasm or other active disease within the thorax. Aortic Atherosclerosis (ICD10-I70.0) and Emphysema (ICD10-J43.9). Coronary artery atherosclerosis. Electronically Signed   By: Marlaine Hind M.D.   On: 05/22/2019 13:12   Vas [place] Burnard Bunting With/wo Tbi  Result Date: 05/02/2019 LOWER EXTREMITY DOPPLER STUDY Indications: Peripheral artery disease.  Vascular Interventions: H/O bilateral femoral-popliteal artery bypass vein  grafts in [place], [place]. Comparison Study: 02/11/2018 Performing Technologist: Concha Norway RVT  Examination Guidelines: A complete evaluation includes at minimum, Doppler waveform signals and systolic blood pressure reading at the level of bilateral brachial, anterior tibial, and posterior tibial arteries, when vessel segments are accessible. Bilateral testing is considered an integral part of a complete examination. Photoelectric Plethysmograph (PPG) waveforms and toe systolic pressure readings are included as required and additional duplex testing as needed. Limited examinations for reoccurring indications may be performed as noted.  ABI Findings: +--------+------------------+-----+---------+--------+  Right    Rt Pressure (mmHg) Index Waveform  Comment   +--------+------------------+-----+---------+--------+  Brachial 169                                           +--------+------------------+-----+---------+--------+  ATA      175                1.04  biphasic            +--------+------------------+-----+---------+--------+  PTA      190                1.12  triphasic           +--------+------------------+-----+---------+--------+ +--------+------------------+-----+---------+-------+  Left     Lt Pressure (mmHg) Index Waveform  Comment  +--------+------------------+-----+---------+-------+  Brachial 165                                         +--------+------------------+-----+---------+-------+  ATA      174                1.03  triphasic          +--------+------------------+-----+---------+-------+  PTA      165                0.98  biphasic           +--------+------------------+-----+---------+-------+ +-------+-----------+-----------+------------+------------+  ABI/TBI Today's ABI Today's TBI Previous ABI Previous TBI  +-------+-----------+-----------+------------+------------+  Right   1.12                    1.25                       +-------+-----------+-----------+------------+------------+  Left    1.03                    1.16                       +-------+-----------+-----------+------------+------------+ Bilateral ABIs appear essentially unchanged compared to prior study on 02/11/2018.  Summary: Right: Resting right ankle-brachial index is within normal range. No evidence of significant right lower extremity arterial disease. Patent InSitu FemPop BPG. Left: Resting left ankle-brachial index is within normal range. No evidence of significant left lower extremity arterial disease. Patent InSitu FemPop BPG.  *See table(s) above for measurements and observations.  Electronically signed by Leotis Pain MD on 05/02/2019 at 1:09:57 PM.   Final    Vas [place] Lower Extremity Bypass Graft Duplex  Result Date: 05/02/2019 LOWER EXTREMITY ARTERIAL DUPLEX STUDY  Vascular Interventions: H/O bilateral femoral-popliteal artery bypass vein  grafts in  [place], [place]. Current ABI:            Rt = 1.12 ; Lt = 1.03 Comparison Study: 02/11/2018 Performing Technologist: Concha Norway RVT  Examination Guidelines: A complete evaluation includes B-mode imaging, spectral Doppler, color Doppler, and power Doppler as needed of all accessible portions of each vessel. Bilateral testing is considered an integral part of a complete examination. Limited examinations for reoccurring indications may be performed as noted.  +----------+--------+-----+--------+----------+-----------------+  RIGHT      PSV cm/s Ratio Stenosis Waveform   Comments           +----------+--------+-----+--------+----------+-----------------+  CFA Mid    130                     biphasic                      +----------+--------+-----+--------+----------+-----------------+  DFA        110                     monophasic                    +----------+--------+-----+--------+----------+-----------------+  SFA Prox   138                     biphasic   InSitu FemPop BPG  +----------+--------+-----+--------+----------+-----------------+  SFA Mid    65                      triphasic  InSitu FemPop BPG  +----------+--------+-----+--------+----------+-----------------+  SFA Distal 69                      biphasic   InSitu FemPop BPG  +----------+--------+-----+--------+----------+-----------------+  POP Distal 67                      biphasic                      +----------+--------+-----+--------+----------+-----------------+  ATA Distal 79                      biphasic                      +----------+--------+-----+--------+----------+-----------------+  PTA Distal 100                     biphasic                      +----------+--------+-----+--------+----------+-----------------+  +----------+--------+-----+--------+----------+--------------------------------+  LEFT       PSV cm/s Ratio Stenosis Waveform   Comments                           +----------+--------+-----+--------+----------+--------------------------------+  CFA Mid    102                     biphasic                                     +----------+--------+-----+--------+----------+--------------------------------+  DFA        121                     monophasic                                   +----------+--------+-----+--------+----------+--------------------------------+  SFA Prox   58                      biphasic   InSitu FemPop BPG                 +----------+--------+-----+--------+----------+--------------------------------+  SFA Mid    79                      biphasic   InSitu FemPop BPG                 +----------+--------+-----+--------+----------+--------------------------------+  SFA Distal 99                      biphasic   InSitu FemPop BPG                 +----------+--------+-----+--------+----------+--------------------------------+  POP Distal 88                      biphasic                                     +----------+--------+-----+--------+----------+--------------------------------+  ATA Distal 77                      biphasic                                     +----------+--------+-----+--------+----------+--------------------------------+  PTA Distal 14                      monophasic Distal PTA occluded with                                                         collateral flow seen              +----------+--------+-----+--------+----------+--------------------------------+  Summary: Right: Patent InSitu FemPop BPG with no evidence of stenosis. The proximal vein graft is dilated to 1.26cm. Left: Patent InSitu FemPop BPG with no evidence of stenosis. The distal PTA is occluded with collateral flow seen. History of ankle reconstruction surgery.  See table(s) above for measurements and observations. Electronically signed by Leotis Pain MD on 05/02/2019 at 1:09:53 PM.    Final    Vas [place] Lower Extremity Venous Reflux  Result Date: 05/09/2019  Lower Venous  Reflux Study Indications: Swelling. Other Indications: H/O bilateral femoral-popliteal BPGs. Performing Technologist: Blondell Reveal RT, RDMS, RVT  Examination Guidelines: A complete evaluation includes B-mode imaging, spectral Doppler, color Doppler, and power Doppler as needed of all accessible portions of each vessel. Bilateral testing is considered an integral part of a complete examination. Limited examinations for reoccurring indications may be performed as noted. The reflux portion of the exam is performed with the patient in reverse Trendelenburg.  +---------+---------------+---------+-----------+----------+--------------+  RIGHT     Compressibility Phasicity Spontaneity Properties Summary         +---------+---------------+---------+-----------+----------+--------------+  CFV       Full            Yes       Yes                                    +---------+---------------+---------+-----------+----------+--------------+  SFJ                                                        not visualized  +---------+---------------+---------+-----------+----------+--------------+  FV Prox   Full                                                             +---------+---------------+---------+-----------+----------+--------------+  FV Mid    Full            Yes       Yes                                    +---------+---------------+---------+-----------+----------+--------------+  FV Distal Full                                                             +---------+---------------+---------+-----------+----------+--------------+  POP       Full            Yes       Yes                                    +---------+---------------+---------+-----------+----------+--------------+  SSV       Full                                                             +---------+---------------+---------+-----------+----------+--------------+  +---------+---------------+---------+-----------+----------+-------------------+  LEFT       Compressibility Phasicity Spontaneity Properties Summary              +---------+---------------+---------+-----------+----------+-------------------+  CFV       Full            Yes       Yes                                         +---------+---------------+---------+-----------+----------+-------------------+  SFJ       Full                                             patent, competent  superior branch      +---------+---------------+---------+-----------+----------+-------------------+  FV Prox   Full                                                                  +---------+---------------+---------+-----------+----------+-------------------+  FV Mid    Full            Yes       Yes                                         +---------+---------------+---------+-----------+----------+-------------------+  FV Distal Full                                                                  +---------+---------------+---------+-----------+----------+-------------------+  POP       Full            Yes       Yes                                         +---------+---------------+---------+-----------+----------+-------------------+  SSV       Full                                                                  +---------+---------------+---------+-----------+----------+-------------------+  Right Reflux Technical Findings: No significant reflux in the SSV or femoral-popliteal venous system. The GSV appears to have been used for a patent femoral-popliteal artery BPG.  Left Reflux Technical Findings: No significant reflux in the SSV or femoral-popliteal venous system. The GSV appears to have been used for a patent femoral-popliteal artery BPG.  Summary: Right: There is no evidence of chronic venous insufficiency.There is no evidence of deep vein thrombosis in the lower extremity.There is no evidence of superficial venous thrombosis. Left: There is no evidence of  chronic venous insufficiency.There is no evidence of deep vein thrombosis in the lower extremity.There is no evidence of superficial venous thrombosis.  *See table(s) above for measurements and observations. Electronically signed by Leotis Pain MD on 05/09/2019 at 12:27:25 PM.    Final      Assessment & Plan:   COPD (chronic obstructive pulmonary disease) ([place]) Patient presents today for follow-up visit.  She was last seen by Dr. Vaughan Browner on 03/24/2019.  She was started on Breo but states that this did not help.  She was then started on Anoro.  She states that this did not help so she quit the medication.  She states that she has been stable and her albuterol rescue inhaler does help when needed.  She states that anxiety and claustrophobia play a big role in her shortness of breath.  She did have lab  work-up at last visit which was overall negative.  Patient recently had CT chest to follow-up on lung nodules which showed stable tiny calcified granulomata in both lungs and emphysema.  Patient does have an upcoming visit with cardiology scheduled cardiac work-up.  Patient does still complain of shortness of breath with exertion.  She denies any significant cough.   Patient Instructions  Continue albuterol as needed Discussed CT in office today    Follow up: Follow up with Dr. Vaughan Browner in 3 months or sooner if needed       Fenton Foy, NP 05/24/2019

## 2019-06-05 DIAGNOSIS — E7800 Pure hypercholesterolemia, unspecified: Secondary | ICD-10-CM | POA: Diagnosis not present

## 2019-06-13 ENCOUNTER — Ambulatory Visit (HOSPITAL_COMMUNITY): Payer: Medicare Other | Attending: Cardiovascular Disease

## 2019-06-13 ENCOUNTER — Other Ambulatory Visit: Payer: Self-pay

## 2019-06-13 DIAGNOSIS — R0609 Other forms of dyspnea: Secondary | ICD-10-CM | POA: Diagnosis not present

## 2019-06-13 DIAGNOSIS — R0600 Dyspnea, unspecified: Secondary | ICD-10-CM

## 2019-06-30 ENCOUNTER — Other Ambulatory Visit (HOSPITAL_COMMUNITY): Payer: Medicare Other

## 2019-06-30 ENCOUNTER — Other Ambulatory Visit: Payer: Self-pay

## 2019-06-30 ENCOUNTER — Encounter: Payer: Self-pay | Admitting: Cardiology

## 2019-06-30 ENCOUNTER — Other Ambulatory Visit: Payer: Medicare Other | Admitting: Cardiology

## 2019-06-30 ENCOUNTER — Ambulatory Visit (INDEPENDENT_AMBULATORY_CARE_PROVIDER_SITE_OTHER): Payer: Medicare Other | Admitting: Cardiology

## 2019-06-30 VITALS — BP 154/86 | HR 67 | Ht 65.0 in | Wt 151.0 lb

## 2019-06-30 DIAGNOSIS — Z712 Person consulting for explanation of examination or test findings: Secondary | ICD-10-CM | POA: Diagnosis not present

## 2019-06-30 DIAGNOSIS — Z01812 Encounter for preprocedural laboratory examination: Secondary | ICD-10-CM

## 2019-06-30 DIAGNOSIS — I517 Cardiomegaly: Secondary | ICD-10-CM | POA: Diagnosis not present

## 2019-06-30 DIAGNOSIS — I208 Other forms of angina pectoris: Secondary | ICD-10-CM | POA: Insufficient documentation

## 2019-06-30 DIAGNOSIS — R072 Precordial pain: Secondary | ICD-10-CM | POA: Diagnosis not present

## 2019-06-30 MED ORDER — METOPROLOL SUCCINATE ER 25 MG PO TB24: 25.0000 mg | ORAL_TABLET | Freq: Every day | ORAL | 11 refills | Status: DC

## 2019-06-30 NOTE — Progress Notes (Signed)
Cardiology Office Note:    Date:  06/30/2019   ID:  Deborah Waller, DOB 1941/11/08, MRN [id number]  PCP:  Donald Prose, MD  Cardiologist:  Buford Dresser, MD PhD  Referring MD: Donald Prose, MD   CC: follow up, discuss test results  History of Present Illness:    Deborah Waller (Pronouced ko-TONE) is a 78 y.o. female with a hx of PMH mild COPD, pulmondary nodules (followed by Dr. Vaughan Browner), PAD with prior bilateral bypass surgery followed by Dr. Lucky Cowboy, hyperlipidemia, reported hypertension but on no meds, former tobacco abuse, claustrophobia/anxiety, neuropathy. (Denies history of type II diabetes, last A1c 5.9) who is seen for follow up today. Initial consultation (virtual) with me on 04/27/19.  Today: Reviewed echo today at length. Reviewed septal hypertrophy at length. No notation of LVOT gradient or SAM to suggest obstruction. Discussed general prevention strategy.  She is still very concerned about her arm and back symptoms as possible angina. Also very short of breath with minimal activity and occasionally also at rest. Reviewed options for management. Cannot treadmill, felt like she was dying with lexiscan. Reviewed CT coronary as option for evaluation. Does have a history of migraines. Reviewed that NG has to be given, this may cause headache. Recommended prophylaxis with tylenol prior to CT scan for this.   Denies PND, orthopnea, new LE edema (chronic L ankle mild edema) or unexpected weight gain. No syncope or palpitations.  Past Medical History:  Diagnosis Date   Anxiety    Neuropathy of both feet     Past Surgical History:  Procedure Laterality Date   ABDOMINAL HYSTERECTOMY  1972   ANKLE RECONSTRUCTION Left 12/2007   BREAST EXCISIONAL BIOPSY Right    BREAST LUMPECTOMY Left    BUNIONECTOMY Left 2002, 2003   CATARACT EXTRACTION Left 11/2001   CATARACT EXTRACTION Right 10/2001   GALLBLADDER SURGERY  2000   leg bypass- left  03/04/2011   leg  bypass-right   04/22/2011   LIPOMA EXCISION  07/18/2014   OOPHORECTOMY  1995   REPLACEMENT TOTAL KNEE Right 12/08/2011   TOE SURGERY Left 2010   TOTAL HIP ARTHROPLASTY Right 11/13/2008   TOTAL HIP ARTHROPLASTY Left 06/2005    Current Medications: Current Outpatient Medications on File Prior to Visit  Medication Sig   albuterol (PROVENTIL HFA;VENTOLIN HFA) 108 (90 Base) MCG/ACT inhaler Inhale 2 puffs into the lungs every 6 (six) hours as needed for wheezing or shortness of breath.   aspirin EC 81 MG tablet Take 1 tablet (81 mg total) by mouth daily.   atorvastatin (LIPITOR) 20 MG tablet Take 1 tablet by mouth daily.   Blood Glucose Monitoring Suppl (ACCU-CHEK AVIVA PLUS) w/Device KIT USE SEVERAL TIMES A WEEK UTD   Cholecalciferol (VITAMIN D) 2000 units CAPS Take 2,000 Units by mouth daily.   dicyclomine (BENTYL) 20 MG tablet TK 1 T PO QID PRN   omeprazole (PRILOSEC) 20 MG capsule Take 1 capsule by mouth daily.   ondansetron (ZOFRAN-ODT) 4 MG disintegrating tablet PLACE 1 T ON THE TONGUE AND ALLOW TO DISSOLVE Q 6 H PRN   pregabalin (LYRICA) 150 MG capsule Take 150 mg by mouth 2 (two) times daily.   TRINTELLIX 10 MG TABS tablet Take 1 tablet by mouth daily.   No current facility-administered medications on file prior to visit.      Allergies:   Patient has no active allergies.   Social History   Socioeconomic History   Marital status: Married    Spouse name: Not  on file   Number of children: Not on file   Years of education: Not on file   Highest education level: Not on file  Occupational History   Not on file  Social Needs   Financial resource strain: Not on file   Food insecurity    Worry: Not on file    Inability: Not on file   Transportation needs    Medical: Not on file    Non-medical: Not on file  Tobacco Use   Smoking status: Former Smoker    Packs/day: 1.00    Years: 33.00    Pack years: 33.00    Types: Cigarettes    Quit date:  11/10/1987    Years since quitting: 31.6   Smokeless tobacco: Never Used  Substance and Sexual Activity   Alcohol use: No   Drug use: No   Sexual activity: Not on file  Lifestyle   Physical activity    Days per week: Not on file    Minutes per session: Not on file   Stress: Not on file  Relationships   Social connections    Talks on phone: Not on file    Gets together: Not on file    Attends religious service: Not on file    Active member of club or organization: Not on file    Attends meetings of clubs or organizations: Not on file    Relationship status: Not on file  Other Topics Concern   Not on file  Social History Narrative   Married, lives with husband, has one child, and is retired.      Family History: The patient's family history includes Allergies in her sister.  ROS:   Please see the history of present illness.  Additional pertinent ROS: Constitutional: Negative for chills, fever, night sweats, unintentional weight loss  HENT: Negative for ear pain and hearing loss.   Eyes: Negative for loss of vision and eye pain.  Respiratory: Negative for cough, sputum, wheezing.   Cardiovascular: See HPI. Gastrointestinal: Negative for abdominal pain, melena, and hematochezia.  Genitourinary: Negative for dysuria and hematuria.  Musculoskeletal: Negative for falls and myalgias.  Skin: Negative for itching and rash.  Neurological: Negative for focal weakness, focal sensory changes and loss of consciousness.  Endo/Heme/Allergies: Does not bruise/bleed easily.     EKGs/Labs/Other Studies Reviewed:    The following studies were reviewed today: Echo 06/13/19 1. The left ventricle has hyperdynamic systolic function, with an ejection fraction of >65%. The cavity size was normal. There is severe asymmetric left ventricular hypertrophy. Left ventricular diastolic Doppler parameters are consistent with impaired  relaxation.  2. The right ventricle has normal systolic  function. The cavity was normal. There is no increase in right ventricular wall thickness.  3. Trivial pericardial effusion is present.  4. No stenosis of the aortic valve.  5. The aorta is normal in size and structure.  6. The aortic root and ascending aorta are normal in size and structure.  7. Grossly normal.  8. The average left ventricular global longitudinal strain is -14.8 %.  EKG:  EKG is personally reviewed.  The ekg ordered today demonstrates NSR with nonspecific ST changes, HR 67 bpm  Recent Labs: 03/24/2019: ALT 19; BUN 23; Creat 0.89; Hemoglobin 14.5; Platelets 287.0; Potassium 4.0; Sodium 142  Recent Lipid Panel No results found for: CHOL, TRIG, HDL, CHOLHDL, VLDL, LDLCALC, LDLDIRECT  Physical Exam:    VS:  BP (!) 154/86    Pulse 67    Ht  _0  (1.651 m)    Wt 151 lb (68.5 kg)    SpO2 98%    BMI 25.13 kg/m     Wt Readings from Last 3 Encounters:  06/30/19 151 lb (68.5 kg)  05/24/19 151 lb 12.8 oz (68.9 kg)  05/02/19 152 lb 9.6 oz (69.2 kg)     GEN: Well nourished, well developed in no acute distress HEENT: Normal, moist mucous membranes NECK: No JVD CARDIAC: regular rhythm, normal S1 and S2, no murmurs, rubs, gallops. VASCULAR: Radial and DP pulses 2+ bilaterally. No carotid bruits RESPIRATORY:  Clear to auscultation without rales, wheezing or rhonchi  ABDOMEN: Soft, non-tender, non-distended MUSCULOSKELETAL:  Ambulates independently SKIN: Warm and dry, trivial L ankle edema NEUROLOGIC:  Alert and oriented x 3. No focal neuro deficits noted. PSYCHIATRIC:  Normal affect    ASSESSMENT:    1. Encounter to discuss test results   2. Precordial pain   3. Pre-procedure lab exam   4. Anginal equivalent ([place])   5. LVH (left ventricular hypertrophy)    PLAN:   Severe asymmetric LV hypertrophy, with septum 1.96 cm: no clear murmur, no obvious obstruction on echo. However, with her shortness of breath with even minimal exertion, cannot exclude transient  obstruction -discussed how this is managed at length -avoid dehydration -will trial metoprolol succinate 25 mg to see if this helps her symptoms. Reviewed side effects.  Possible anginal equivalents: shortness of breath, upper extremity and back pain with minimal exertion -discussed options for evaluation today. Cannot treadmill, prior lexiscan felt like she was dying -will pursue coronary CT. Will not need additional metoprolol. Did discuss that she will need SL NG for best images. Recommended prophylaxis for headaches with tylenol. Recommend she hydrate, may also need small IV fluid bolus when NG given (would not expect obstruction with NG and lying still, especially with no history of obstruction, but can prevent with ample hydration)  Plan for follow up: 6 mos or sooner based on results of testing  Medication Adjustments/Labs and Tests Ordered: Current medicines are reviewed at length with the patient today.  Concerns regarding medicines are outlined above.  Orders Placed This Encounter  Procedures   CT CORONARY MORPH W/CTA COR W/SCORE W/CA W/CM &/OR WO/CM   CT CORONARY FRACTIONAL FLOW RESERVE DATA PREP   CT CORONARY FRACTIONAL FLOW RESERVE FLUID ANALYSIS   Basic metabolic panel   EKG 32-IZTI   Meds ordered this encounter  Medications   metoprolol succinate (TOPROL-XL) 25 MG 24 hr tablet    Sig: Take 1 tablet (25 mg total) by mouth daily. Take with or immediately following a meal.    Dispense:  30 tablet    Refill:  11    Patient Instructions  Medication Instructions:  Start: Metoprolol 25 mg daily  If you need a refill on your cardiac medications before your next appointment, please call your pharmacy.   Lab work: Your physician recommends that you return for lab work 1 week prior to procedure (BMP).  If you have labs (blood work) drawn today and your tests are completely normal, you will receive your results only by:  [place] (if you have MyChart) OR  A  paper copy in the mail If you have any lab test that is abnormal or we need to change your treatment, we will call you to review the results.  Testing/Procedures: Your physician has requested that you have cardiac CT. Cardiac computed tomography (CT) is a painless test that uses an x-ray machine to  take clear, detailed pictures of your heart. For further information please visit HugeFiesta.tn. Please follow instruction sheet as given. [facility]   Follow-Up: At [facility], you and your health needs are our priority.  As part of our continuing mission to provide you with exceptional heart care, we have created designated Provider Care Teams.  These Care Teams include your primary Cardiologist (physician) and Advanced Practice Providers (APPs -  Physician Assistants and Nurse Practitioners) who all work together to provide you with the care you need, when you need it. You will need a follow up appointment in 6 months.  Please call our office 2 months in advance to schedule this appointment.  You may see Buford Dresser, MD or one of the following Advanced Practice Providers on your designated Care Team:   Rosaria Ferries, PA-C  Jory Sims, DNP, ANP  Your cardiac CT will be scheduled at one of the below locations:   [facility] [address] [place], [place] [address] [phone]  Please arrive at the [facility] main entrance of [facility] 30-45 minutes prior to test start time. Proceed to the [facility] Radiology Department (first floor) to check-in and test prep.  Please follow these instructions carefully (unless otherwise directed):  On the Night Before the Test:  Be sure to Drink plenty of water.  Do not consume any caffeinated/decaffeinated beverages or chocolate 12 hours prior to your test.  Do not take any antihistamines 12 hours prior to your test.  If you take Metformin do not take 24 hours prior to test.  If the  patient has contrast allergy: ? Patient will need a prescription for Prednisone and very clear instructions (as follows): 1. Prednisone 50 mg - take 13 hours prior to test 2. Take another Prednisone 50 mg 7 hours prior to test 3. Take another Prednisone 50 mg 1 hour prior to test 4. Take Benadryl 50 mg 1 hour prior to test  Patient must complete all four doses of above prophylactic medications.  Patient will need a ride after test due to Benadryl.  On the Day of the Test:  Drink plenty of water. Do not drink any water within one hour of the test.  Do not eat any food 4 hours prior to the test.  You may take your regular medications prior to the test.   HOLD Furosemide/Hydrochlorothiazide morning of the test.  FEMALES- please wear underwire-free bra if available  Take Tylenol prior to procedure       After the Test:  Drink plenty of water.  After receiving IV contrast, you may experience a mild flushed feeling. This is normal.  On occasion, you may experience a mild rash up to 24 hours after the test. This is not dangerous. If this occurs, you can take Benadryl 25 mg and increase your fluid intake.  If you experience trouble breathing, this can be serious. If it is severe call 911 IMMEDIATELY. If it is mild, please call our office.  If you take any of these medications: Glipizide/Metformin, Avandament, Glucavance, please do not take 48 hours after completing test.    Please contact the cardiac imaging nurse navigator should you have any questions/concerns Marchia Bond, RN Navigator Cardiac Imaging [facility] Services [phone] Office  [phone] Cell        Signed, Buford Dresser, MD PhD 06/30/2019 7:13 PM    [employer]

## 2019-06-30 NOTE — Patient Instructions (Addendum)
Medication Instructions:  Start: Metoprolol 25 mg daily  If you need a refill on your cardiac medications before your next appointment, please call your pharmacy.   Lab work: Your physician recommends that you return for lab work 1 week prior to procedure (BMP).  If you have labs (blood work) drawn today and your tests are completely normal, you will receive your results only by: [place] [facility] MyChart Message (if you have MyChart) OR . A paper copy in the mail If you have any lab test that is abnormal or we need to change your treatment, we will call you to review the results.  Testing/Procedures: Your physician has requested that you have cardiac CT. Cardiac computed tomography (CT) is a painless test that uses an x-ray machine to take clear, detailed pictures of your heart. For further information please visit HugeFiesta.tn. Please follow instruction sheet as given. [facility]   Follow-Up: At [facility], you and your health needs are our priority.  As part of our continuing mission to provide you with exceptional heart care, we have created designated Provider Care Teams.  These Care Teams include your primary Cardiologist (physician) and Advanced Practice Providers (APPs -  Physician Assistants and Nurse Practitioners) who all work together to provide you with the care you need, when you need it. You will need a follow up appointment in 6 months.  Please call our office 2 months in advance to schedule this appointment.  You may see Buford Dresser, MD or one of the following Advanced Practice Providers on your designated Care Team:   Rosaria Ferries, PA-C . Jory Sims, DNP, ANP  Your cardiac CT will be scheduled at one of the below locations:   [facility] [address] [place], [place] [address] [phone]  Please arrive at the [facility] main entrance of [facility] 30-45 minutes prior to test start time. Proceed to the [facility]  Radiology Department (first floor) to check-in and test prep.  Please follow these instructions carefully (unless otherwise directed):  On the Night Before the Test: . Be sure to Drink plenty of water. . Do not consume any caffeinated/decaffeinated beverages or chocolate 12 hours prior to your test. . Do not take any antihistamines 12 hours prior to your test. . If you take Metformin do not take 24 hours prior to test. . If the patient has contrast allergy: ? Patient will need a prescription for Prednisone and very clear instructions (as follows): 1. Prednisone 50 mg - take 13 hours prior to test 2. Take another Prednisone 50 mg 7 hours prior to test 3. Take another Prednisone 50 mg 1 hour prior to test 4. Take Benadryl 50 mg 1 hour prior to test . Patient must complete all four doses of above prophylactic medications. . Patient will need a ride after test due to Benadryl.  On the Day of the Test: . Drink plenty of water. Do not drink any water within one hour of the test. . Do not eat any food 4 hours prior to the test. . You may take your regular medications prior to the test.  . HOLD Furosemide/Hydrochlorothiazide morning of the test. . FEMALES- please wear underwire-free bra if available . Take Tylenol prior to procedure       After the Test: . Drink plenty of water. . After receiving IV contrast, you may experience a mild flushed feeling. This is normal. . On occasion, you may experience a mild rash up to 24 hours  after the test. This is not dangerous. If this occurs, you can take Benadryl 25 mg and increase your fluid intake. . If you experience trouble breathing, this can be serious. If it is severe call 911 IMMEDIATELY. If it is mild, please call our office. . If you take any of these medications: Glipizide/Metformin, Avandament, Glucavance, please do not take 48 hours after completing test.    Please contact the cardiac imaging nurse navigator should you have any  questions/concerns Marchia Bond, RN Navigator Cardiac [place] [facility] Services [phone] Office  [phone] Cell

## 2019-07-07 ENCOUNTER — Other Ambulatory Visit: Payer: Self-pay

## 2019-07-07 ENCOUNTER — Ambulatory Visit
Admission: RE | Admit: 2019-07-07 | Discharge: 2019-07-07 | Disposition: A | Discharge status: Home / Self Care | Payer: Medicare Other | Source: Ambulatory Visit | Attending: Family Medicine | Admitting: Family Medicine

## 2019-07-07 DIAGNOSIS — Z780 Asymptomatic menopausal state: Secondary | ICD-10-CM | POA: Diagnosis not present

## 2019-07-07 DIAGNOSIS — M8580 Other specified disorders of bone density and structure, unspecified site: Secondary | ICD-10-CM

## 2019-07-07 DIAGNOSIS — M85831 Other specified disorders of bone density and structure, right forearm: Secondary | ICD-10-CM | POA: Diagnosis not present

## 2019-07-10 ENCOUNTER — Ambulatory Visit: Payer: Medicare Other | Admitting: Cardiology

## 2019-07-19 DIAGNOSIS — Z01812 Encounter for preprocedural laboratory examination: Secondary | ICD-10-CM | POA: Diagnosis not present

## 2019-07-19 LAB — BASIC METABOLIC PANEL
BUN/Creatinine Ratio: 21 (ref 12–28)
BUN: 17 mg/dL (ref 8–27)
CO2: 26 mmol/L (ref 20–29)
Calcium: 9.7 mg/dL (ref 8.7–10.3)
Chloride: 103 mmol/L (ref 96–106)
Creatinine, Ser: 0.8 mg/dL (ref 0.57–1.00)
GFR calc Af Amer: 82 mL/min/{1.73_m2} (ref >59)
GFR calc non Af Amer: 71 mL/min/{1.73_m2} (ref >59)
Glucose: 107 mg/dL — ABNORMAL HIGH (ref 65–99)
Potassium: 5 mmol/L (ref 3.5–5.2)
Sodium: 142 mmol/L (ref 134–144)

## 2019-07-21 ENCOUNTER — Telehealth (HOSPITAL_COMMUNITY): Payer: Self-pay | Admitting: Emergency Medicine

## 2019-07-21 NOTE — Telephone Encounter (Signed)
Pt returning phone call regarding upcoming cardiac imaging study; pt verbalizes understanding of appt date/time, parking situation and where to check in, pre-test NPO status and medications ordered, and verified current allergies; name and call back number provided for further questions should they arise Marchia Bond RN Navigator Cardiac Imaging [facility] [phone] office [phone] cell  Pt reporting extreme claustrophobia, will be fine with wash cloth over eyes.  Denies covid symptoms.

## 2019-07-21 NOTE — Telephone Encounter (Signed)
Left message on voicemail with name and callback number Takelia Urieta RN Navigator Cardiac Imaging [facility] [phone] Office [phone] Cell  

## 2019-07-24 ENCOUNTER — Encounter (HOSPITAL_COMMUNITY): Payer: Self-pay

## 2019-07-24 ENCOUNTER — Ambulatory Visit (HOSPITAL_COMMUNITY)
Admission: RE | Admit: 2019-07-24 | Discharge: 2019-07-24 | Disposition: A | Discharge status: Home / Self Care | Payer: Medicare Other | Source: Ambulatory Visit | Attending: Cardiology | Admitting: Cardiology

## 2019-07-24 ENCOUNTER — Other Ambulatory Visit: Payer: Self-pay

## 2019-07-24 DIAGNOSIS — R072 Precordial pain: Secondary | ICD-10-CM | POA: Insufficient documentation

## 2019-07-24 MED ORDER — NITROGLYCERIN 0.4 MG SL SUBL
SUBLINGUAL_TABLET | SUBLINGUAL | Status: AC
  Filled 2019-07-24: qty 2

## 2019-07-24 MED ORDER — IOHEXOL 350 MG/ML SOLN
80.0000 mL | Freq: Once | INTRAVENOUS | Status: AC | PRN
  Administered 2019-07-24: 80 mL via INTRAVENOUS

## 2019-07-24 MED ORDER — NITROGLYCERIN 0.4 MG SL SUBL
0.8000 mg | SUBLINGUAL_TABLET | Freq: Once | SUBLINGUAL | Status: AC
  Administered 2019-07-24: 0.8 mg via SUBLINGUAL
  Filled 2019-07-24: qty 25

## 2019-07-27 ENCOUNTER — Other Ambulatory Visit: Payer: Self-pay

## 2019-07-27 MED ORDER — ATORVASTATIN CALCIUM 40 MG PO TABS: 40.0000 mg | ORAL_TABLET | Freq: Every day | ORAL | 3 refills | Status: DC

## 2019-08-23 NOTE — Progress Notes (Signed)
_0  ID: Deborah Waller, female    DOB: September 23, 1941, 78 y.o.   MRN: [id number]  Chief Complaint  Patient presents with   Follow-up    3 month follow up for COPD. States her breathing has been ok since last visit.     Referring provider: Donald Prose, MD  HPI:  78 year old female former smoker followed in our office for COPD/emphysema.  Patient has been tried on maintenance inhalers in the past and has found no improvement.  Patient reports symptomatic improvement when using rescue inhaler.  PMH: Hyperlipidemia, anxiety, claustrophobia Smoker/ Smoking History: Former Smoker. Quit 1989. 33 pack years.  Maintenance:  None, tried Breo and Symbicort in past with no improvement Pt of: Dr. Vaughan Browner  08/24/2019  - Visit   78 year old female former smoker followed in our office for emphysema.  Patient was last seen by Dr. Vaughan Browner earlier this year.  She completed a July/2020 follow-up with [place] NP.  Patient believes that her breathing has been stable since that office visit.  She continues to not be very physically active.  She is limited due to neuropathy and leg pain as well as increased shortness of breath and back pain with walking.  She has been evaluated by both pulmonary and cardiology found to have no acute abnormalities at this time.  She is following up with primary care next week.  Patient admits that she is very deconditioned has not been active for some time.  She is walking with a cane right now due to troubles with mobility.  She has not done any physical therapy recently.  She is using Lyrica to help with her neuropathy at night as well as CBD oil.  She has tried maintenance inhalers in the past to help with management of her dyspnea.  She is attempted to use Breo Ellipta as well as Symbicort.  She has found no success with either of these.  She does report success with using a rescue inhaler.  She has a positive bronchodilator response and mid flow reversibility on her 2018  pulmonary function testing.   Questionaires / Pulmonary Flowsheets:   MMRC: mMRC Dyspnea Scale mMRC Score  08/24/2019 1    Tests:    Imaging Data from previous pulmonologist reviewed including images (except CT scan from 2015) CT scan  07/30/14-. Small lung nodules CT scan 02/07/15- Small pulmonary nodule stable. CT scan 07/18/15-3 to 4 mm pulmonary nodule in the right upper lobe. 2-3 mm calcified nodule in the right lower lobe. 2-3 mm calcified granuloma in the left lower lobe. Subsegmental atelectasis in the right lower lobe. Diffuse emphysematous changes.  Data from [facility] CT scan from here 02/15/17-4 mm right middle lobe nodule, 3 mm right lower lobe nodule, 2 mm right lower lobe nodule, 1-2 mm right lower lobe nodule, 1-2 mm left lower lobe nodule.   CT abdomen pelvis  12/25/17-stable lower lobe calcified nodules.  Lung images are otherwise normal  05/22/2019-CT chest without contrast-mild/moderate centrilobular emphysema again seen, stable mild right lower lobe scarring, tiny calcified granulomas are again seen in both lungs, no suspicious pulmonary nodules or masses are identified, no evidence of pulmonary infiltrate or pleural effusion  PFTs PFT 2016 -mild airflow obstruction, moderate diffusion abnormality as per pulmonologist note. Actual study not sent.  PFTs 02/22/17 FVC 2.62 [89%), FEV1 1.94 (88%), F/F 74, TLC 70%, DLCO 54% Mild obstruction with bronchodilator response, mild restriction with moderate diffusion defect.   FENO:  No results found for: NITRICOXIDE  PFT: PFT Results  Latest Ref Rng & Units 02/22/2017  FVC-Pre L 2.14  FVC-Predicted Pre % 73  FVC-Post L 2.62  FVC-Predicted Post % 89  Pre FEV1/FVC % % 77  Post FEV1/FCV % % 74  FEV1-Pre L 1.66  FEV1-Predicted Pre % 75  FEV1-Post L 1.94  DLCO UNC% % 54  DLCO COR %Predicted % 69  TLC L 3.66  TLC % Predicted % 70  RV % Predicted % 47    WALK:  SIX MIN WALK 08/24/2019  Supplimental Oxygen during  Test? (L/min) No  Tech Comments: Patient was able to complete 1 lap. Was able to hold a conversation without stopping. Patient was a bit unstead during walk due to her neuropathy but denied any SOB or chest pain. O2 was not needed during or after walk.    Imaging: No results found.  Lab Results:  CBC    Component Value Date/Time   WBC 8.9 03/24/2019 1500   RBC 4.90 03/24/2019 1500   HGB 14.5 03/24/2019 1500   HCT 42.4 03/24/2019 1500   PLT 287.0 03/24/2019 1500   MCV 86.6 03/24/2019 1500   MCHC 34.1 03/24/2019 1500   RDW 13.8 03/24/2019 1500   LYMPHSABS 3.9 03/24/2019 1500   MONOABS 0.8 03/24/2019 1500   EOSABS 0.2 03/24/2019 1500   BASOSABS 0.0 03/24/2019 1500    BMET    Component Value Date/Time   NA 142 07/19/2019 1142   K 5.0 07/19/2019 1142   CL 103 07/19/2019 1142   CO2 26 07/19/2019 1142   GLUCOSE 107 (H) 07/19/2019 1142   GLUCOSE 97 03/24/2019 1526   BUN 17 07/19/2019 1142   CREATININE 0.80 07/19/2019 1142   CREATININE 0.89 03/24/2019 1526   CALCIUM 9.7 07/19/2019 1142   GFRNONAA 71 07/19/2019 1142   GFRAA 82 07/19/2019 1142    BNP No results found for: BNP  ProBNP No results found for: PROBNP  Specialty Problems      Pulmonary Problems   COPD (chronic obstructive pulmonary disease) (HCC)    PFTs 02/22/17 FVC 2.62 [89%), FEV1 1.94 (88%), F/F 74, TLC 70%, DLCO 54% Mild obstruction with bronchodilator response, mild restriction with moderate diffusion defect.  05/22/2019-CT chest without contrast-mild/moderate centrilobular emphysema again seen, stable mild right lower lobe scarring, tiny calcified granulomas are again seen in both lungs, no suspicious pulmonary nodules or masses are identified, no evidence of pulmonary infiltrate or pleural effusion       Shortness of breath      Allergies  Allergen Reactions   Oxycodone     Childhood reaction, does not remember the reaction.     Immunization History  Administered Date(s) Administered    Influenza, High Dose Seasonal PF 08/23/2018   Influenza-Unspecified 09/10/2015, 08/08/2017   Pneumococcal Conjugate-13 11/09/2014   Pneumococcal Polysaccharide-23 11/09/2012   Zoster 11/09/2012    Past Medical History:  Diagnosis Date   Anxiety    Neuropathy of both feet     Tobacco History: Social History   Tobacco Use  Smoking Status Former Smoker   Packs/day: 1.00   Years: 33.00   Pack years: 33.00   Types: Cigarettes   Quit date: 11/10/1987   Years since quitting: 31.8  Smokeless Tobacco Never Used   Counseling given: Yes   Continue to not smoke  Outpatient Encounter Medications as of 08/24/2019  Medication Sig   albuterol (PROVENTIL HFA;VENTOLIN HFA) 108 (90 Base) MCG/ACT inhaler Inhale 2 puffs into the lungs every 6 (six) hours as needed for wheezing or shortness of  breath.   aspirin EC 81 MG tablet Take 1 tablet (81 mg total) by mouth daily.   atorvastatin (LIPITOR) 40 MG tablet Take 1 tablet (40 mg total) by mouth daily.   Blood Glucose Monitoring Suppl (ACCU-CHEK AVIVA PLUS) w/Device KIT USE SEVERAL TIMES A WEEK UTD   Cholecalciferol (VITAMIN D) 2000 units CAPS Take 2,000 Units by mouth daily.   dicyclomine (BENTYL) 20 MG tablet TK 1 T PO QID PRN   metoprolol succinate (TOPROL-XL) 25 MG 24 hr tablet Take 1 tablet (25 mg total) by mouth daily. Take with or immediately following a meal.   omeprazole (PRILOSEC) 20 MG capsule Take 1 capsule by mouth daily.   ondansetron (ZOFRAN-ODT) 4 MG disintegrating tablet PLACE 1 T ON THE TONGUE AND ALLOW TO DISSOLVE Q 6 H PRN   pregabalin (LYRICA) 150 MG capsule Take 150 mg by mouth 2 (two) times daily.   TRINTELLIX 10 MG TABS tablet Take 1 tablet by mouth daily.   No facility-administered encounter medications on file as of 08/24/2019.      Review of Systems  Review of Systems  Constitutional: Negative for activity change, fatigue and fever.  HENT: Negative for sinus pressure, sinus pain and sore  throat.   Respiratory: Negative for cough, shortness of breath and wheezing.   Cardiovascular: Negative for chest pain and palpitations.  Gastrointestinal: Negative for diarrhea, nausea and vomiting.  Musculoskeletal: Positive for gait problem. Negative for arthralgias.  Neurological: Positive for weakness. Negative for dizziness.  Psychiatric/Behavioral: Negative for sleep disturbance. The patient is not nervous/anxious.     Physical Exam  BP 118/72    Pulse 76    Temp (!) 96.8 F (36 C) (Temporal)    Ht _0  (1.651 m)    Wt 150 lb 12.8 oz (68.4 kg)    SpO2 96%    BMI 25.09 kg/m   Wt Readings from Last 5 Encounters:  08/24/19 150 lb 12.8 oz (68.4 kg)  06/30/19 151 lb (68.5 kg)  05/24/19 151 lb 12.8 oz (68.9 kg)  05/02/19 152 lb 9.6 oz (69.2 kg)  04/28/19 151 lb (68.5 kg)    BMI Readings from Last 5 Encounters:  08/24/19 25.09 kg/m  06/30/19 25.13 kg/m  05/24/19 22.42 kg/m  05/02/19 25.39 kg/m  04/28/19 25.13 kg/m    Physical Exam Vitals signs and nursing note reviewed.  Constitutional:      General: She is not in acute distress.    Appearance: Normal appearance.  HENT:     Head: Normocephalic and atraumatic.     Right Ear: Tympanic membrane, ear canal and external ear normal. There is no impacted cerumen.     Left Ear: Tympanic membrane, ear canal and external ear normal. There is no impacted cerumen.     Nose: Nose normal. No congestion.     Mouth/Throat:     Mouth: Mucous membranes are moist.     Pharynx: Oropharynx is clear.  Eyes:     Pupils: Pupils are equal, round, and reactive to light.  Neck:     Musculoskeletal: Normal range of motion.  Cardiovascular:     Rate and Rhythm: Normal rate and regular rhythm.     Pulses: Normal pulses.     Heart sounds: Normal heart sounds. No murmur.  Pulmonary:     Effort: Pulmonary effort is normal. No respiratory distress.     Breath sounds: Normal breath sounds. No decreased air movement. No decreased breath  sounds, wheezing or rales.  Skin:  General: Skin is warm and dry.     Capillary Refill: Capillary refill takes less than 2 seconds.  Neurological:     General: No focal deficit present.     Mental Status: She is alert and oriented to person, place, and time. Mental status is at baseline.     Gait: Gait abnormal (Walked in office, completed 1 lap, unsteady gait without use of cane).  Psychiatric:        Mood and Affect: Mood normal.        Behavior: Behavior normal.        Thought Content: Thought content normal.        Judgment: Judgment normal.     Comments: Patient with history of claustrophobia, prefers to have room door open for exam      Assessment & Plan:   COPD (chronic obstructive pulmonary disease) ([place]) Plan: Continue use rescue inhaler as needed for shortness of breath and wheezing Order for home physical therapy today Start to increase daily physical activity and ambulation Use appropriate assistive devices such as cane if you are unsteady on your feet Walk today in office completed 1 lap without any oxygen desaturations, unsteady gait without cane Follow-up in 4 months  Physical deconditioning Plan: Referral to home health physical therapy Start increasing daily physical activity Continue to follow-up with primary care and cardiology  Shortness of breath Plan: Continue to follow-up with cardiology and primary care We will order physical therapy Walk today in office patient completed 1 lap without any oxygen desaturations Unsteady gait when walking without cane    Return in about 4 months (around 12/25/2019), or if symptoms worsen or fail to improve, for Follow up with Dr. Vaughan Browner.   Lauraine Rinne, NP 08/24/2019   This appointment was 28 minutes long with over 50% of the time in direct face-to-face patient care, assessment, plan of care, and follow-up.

## 2019-08-24 ENCOUNTER — Ambulatory Visit (INDEPENDENT_AMBULATORY_CARE_PROVIDER_SITE_OTHER): Payer: Medicare Other | Admitting: Pulmonary Disease

## 2019-08-24 ENCOUNTER — Encounter: Payer: Self-pay | Admitting: Pulmonary Disease

## 2019-08-24 ENCOUNTER — Ambulatory Visit: Payer: Medicare Other | Admitting: Nurse Practitioner

## 2019-08-24 VITALS — BP 118/72 | HR 76 | Temp 96.8°F | Ht 65.0 in | Wt 150.8 lb

## 2019-08-24 DIAGNOSIS — J449 Chronic obstructive pulmonary disease, unspecified: Secondary | ICD-10-CM | POA: Diagnosis not present

## 2019-08-24 DIAGNOSIS — R0602 Shortness of breath: Secondary | ICD-10-CM | POA: Diagnosis not present

## 2019-08-24 DIAGNOSIS — R5381 Other malaise: Secondary | ICD-10-CM | POA: Diagnosis not present

## 2019-08-24 NOTE — Assessment & Plan Note (Signed)
Plan: Continue to follow-up with cardiology and primary care We will order physical therapy Walk today in office patient completed 1 lap without any oxygen desaturations Unsteady gait when walking without cane

## 2019-08-24 NOTE — Assessment & Plan Note (Signed)
Plan: Continue use rescue inhaler as needed for shortness of breath and wheezing Order for home physical therapy today Start to increase daily physical activity and ambulation Use appropriate assistive devices such as cane if you are unsteady on your feet Walk today in office completed 1 lap without any oxygen desaturations, unsteady gait without cane Follow-up in 4 months

## 2019-08-24 NOTE — Assessment & Plan Note (Signed)
Plan: Referral to home health physical therapy Start increasing daily physical activity Continue to follow-up with primary care and cardiology

## 2019-08-24 NOTE — Patient Instructions (Addendum)
You were seen today by Lauraine Rinne, NP  for:   1. Chronic obstructive pulmonary disease, unspecified COPD type ([place])  Note your daily symptoms > remember "red flags" for COPD:   >>>Increase in cough >>>increase in sputum production >>>increase in shortness of breath or activity  intolerance.   If you notice these symptoms, please call the office to be seen.   Only use your albuterol as a rescue medication to be used if you can't catch your breath by resting or doing a relaxed purse lip breathing pattern.  - The less you use it, the better it will work when you need it. - Ok to use up to 2 puffs  every 4 hours if you must but call for immediate appointment if use goes up over your usual need - Don't leave home without it !!  (think of it like the spare tire for your car)   Start to increase her daily physical activity  2. Shortness of breath  Walk today in office  Continue to follow-up with primary care regarding shortness of breath and cardiology  3. Physical deconditioning  We will refer you to home health for home physical therapy  Start increasing daily physical activity every day including arm exercises as well as ambulation   We recommend today:  Orders Placed This Encounter  Procedures  . Ambulatory referral to Home Health    Referral Priority:   Routine    Referral Type:   Home Health Care    Referral Reason:   Specialty Services Required    Requested Specialty:   [place]    Number of Visits Requested:   1   Orders Placed This Encounter  Procedures  . Ambulatory referral to Home Health   No orders of the defined types were placed in this encounter.   Follow Up:    Return in about 4 months (around 12/25/2019), or if symptoms worsen or fail to improve, for Follow up with Dr. Vaughan Browner.   Please do your part to reduce the spread of COVID-19:      Reduce your risk of any infection  and COVID19 by using the similar precautions used for avoiding  the common cold or flu:  Marland Kitchen Wash your hands often with soap and warm water for at least 20 seconds.  If soap and water are not readily available, use an alcohol-based hand sanitizer with at least 60% alcohol.  . If coughing or sneezing, cover your mouth and nose by coughing or sneezing into the elbow areas of your shirt or coat, into a tissue or into your sleeve (not your hands). Langley Gauss A MASK when in public  . Avoid shaking hands with others and consider head nods or verbal greetings only. . Avoid touching your eyes, nose, or mouth with unwashed hands.  . Avoid close contact with people who are sick. . Avoid places or events with large numbers of people in one location, like concerts or sporting events. . If you have some symptoms but not all symptoms, continue to monitor at home and seek medical attention if your symptoms worsen. . If you are having a medical emergency, call 911.   [place] / e-Visit: [email or web address]         [facility]: [facility]: [id number]                   [facility]:  [id number]     It is flu season:   >>> Best ways to protect herself from the flu: Receive the yearly flu vaccine, practice good hand hygiene washing with soap and also using hand sanitizer when available, eat a nutritious meals, get adequate rest, hydrate appropriately   Please contact the office if your symptoms worsen or you have concerns that you are not improving.   Thank you for choosing [facility] for your healthcare, and for allowing [place] to partner with you on your healthcare journey. I am thankful to be able to provide care to you today.   Wyn Quaker FNP-C     COPD and Physical Activity Chronic obstructive pulmonary disease (COPD) is a long-term (chronic) condition that affects the lungs. COPD is a general term  that can be used to describe many different lung problems that cause lung swelling (inflammation) and limit airflow, including chronic bronchitis and emphysema. The main symptom of COPD is shortness of breath, which makes it harder to do even simple tasks. This can also make it harder to exercise and be active. Talk with your health care provider about treatments to help you breathe better and actions you can take to prevent breathing problems during physical activity. What are the benefits of exercising with COPD? Exercising regularly is an important part of a healthy lifestyle. You can still exercise and do physical activities even though you have COPD. Exercise and physical activity improve your shortness of breath by increasing blood flow (circulation). This causes your heart to pump more oxygen through your body. Moderate exercise can improve your:  Oxygen use.  Energy level.  Shortness of breath.  Strength in your breathing muscles.  Heart health.  Sleep.  Self-esteem and feelings of self-worth.  Depression, stress, and anxiety levels. Exercise can benefit everyone with COPD. The severity of your disease may affect how hard you can exercise, especially at first, but everyone can benefit. Talk with your health care provider about how much exercise is safe for you, and which activities and exercises are safe for you. What actions can I take to prevent breathing problems during physical activity?  Sign up for a pulmonary rehabilitation program. This type of program may include: ? Education about lung diseases. ? Exercise classes that teach you how to exercise and be more active while improving your breathing. This usually involves:  Exercise using your lower extremities, such as a stationary bicycle.  About 30 minutes of exercise, 2 to 5 times per week, for 6 to 12 weeks  Strength training, such as push ups or leg lifts. ? Nutrition education. ? Group classes in which you can talk  with others who also have COPD and learn ways to manage stress.  If you use an oxygen tank, you should use it while you exercise. Work with your health care provider to adjust your oxygen for your physical activity. Your resting flow rate is different from your flow rate during physical activity.  While you are exercising: ? Take slow breaths. ? Pace yourself and do not try to go too fast. ? Purse your lips while breathing out. Pursing your lips is similar to a kissing or whistling position. ? If doing exercise that uses a quick burst of effort, such as weight lifting:  Breathe in before starting the exercise.  Breathe out during the hardest part of the exercise (such as raising the weights). Where to find support You can find support for exercising with COPD from:  Your health  care provider.  A pulmonary rehabilitation program.  Your local health department or community health programs.  Support groups, online or in-person. Your health care provider may be able to recommend support groups. Where to find more information You can find more information about exercising with COPD from:  American Lung Association: ClassInsider.se.  COPD Foundation: https://www.rivera.net/. Contact a health care provider if:  Your symptoms get worse.  You have chest pain.  You have nausea.  You have a fever.  You have trouble talking or catching your breath.  You want to start a new exercise program or a new activity. Summary  COPD is a general term that can be used to describe many different lung problems that cause lung swelling (inflammation) and limit airflow. This includes chronic bronchitis and emphysema.  Exercise and physical activity improve your shortness of breath by increasing blood flow (circulation). This causes your heart to provide more oxygen to your body.  Contact your health care provider before starting any exercise program or new activity. Ask your health care provider what  exercises and activities are safe for you. This information is not intended to replace advice given to you by your health care provider. Make sure you discuss any questions you have with your health care provider. Document Released: 11/18/2017 Document Revised: 02/15/2019 Document Reviewed: 11/18/2017 Elsevier Patient Education  2020 [employer].

## 2019-08-26 DIAGNOSIS — F419 Anxiety disorder, unspecified: Secondary | ICD-10-CM | POA: Diagnosis not present

## 2019-08-26 DIAGNOSIS — R2681 Unsteadiness on feet: Secondary | ICD-10-CM | POA: Diagnosis not present

## 2019-08-26 DIAGNOSIS — Z87891 Personal history of nicotine dependence: Secondary | ICD-10-CM | POA: Diagnosis not present

## 2019-08-26 DIAGNOSIS — G629 Polyneuropathy, unspecified: Secondary | ICD-10-CM | POA: Diagnosis not present

## 2019-08-26 DIAGNOSIS — F40240 Claustrophobia: Secondary | ICD-10-CM | POA: Diagnosis not present

## 2019-08-26 DIAGNOSIS — E785 Hyperlipidemia, unspecified: Secondary | ICD-10-CM | POA: Diagnosis not present

## 2019-08-26 DIAGNOSIS — R5381 Other malaise: Secondary | ICD-10-CM | POA: Diagnosis not present

## 2019-08-26 DIAGNOSIS — J449 Chronic obstructive pulmonary disease, unspecified: Secondary | ICD-10-CM | POA: Diagnosis not present

## 2019-08-30 DIAGNOSIS — J449 Chronic obstructive pulmonary disease, unspecified: Secondary | ICD-10-CM | POA: Diagnosis not present

## 2019-08-30 DIAGNOSIS — R5381 Other malaise: Secondary | ICD-10-CM | POA: Diagnosis not present

## 2019-08-30 DIAGNOSIS — E785 Hyperlipidemia, unspecified: Secondary | ICD-10-CM | POA: Diagnosis not present

## 2019-08-30 DIAGNOSIS — G629 Polyneuropathy, unspecified: Secondary | ICD-10-CM | POA: Diagnosis not present

## 2019-08-30 DIAGNOSIS — F419 Anxiety disorder, unspecified: Secondary | ICD-10-CM | POA: Diagnosis not present

## 2019-08-30 DIAGNOSIS — R2681 Unsteadiness on feet: Secondary | ICD-10-CM | POA: Diagnosis not present

## 2019-09-02 DIAGNOSIS — F419 Anxiety disorder, unspecified: Secondary | ICD-10-CM | POA: Diagnosis not present

## 2019-09-02 DIAGNOSIS — R2681 Unsteadiness on feet: Secondary | ICD-10-CM | POA: Diagnosis not present

## 2019-09-02 DIAGNOSIS — R5381 Other malaise: Secondary | ICD-10-CM | POA: Diagnosis not present

## 2019-09-02 DIAGNOSIS — G629 Polyneuropathy, unspecified: Secondary | ICD-10-CM | POA: Diagnosis not present

## 2019-09-02 DIAGNOSIS — E785 Hyperlipidemia, unspecified: Secondary | ICD-10-CM | POA: Diagnosis not present

## 2019-09-02 DIAGNOSIS — J449 Chronic obstructive pulmonary disease, unspecified: Secondary | ICD-10-CM | POA: Diagnosis not present

## 2019-09-05 DIAGNOSIS — F419 Anxiety disorder, unspecified: Secondary | ICD-10-CM | POA: Diagnosis not present

## 2019-09-05 DIAGNOSIS — G629 Polyneuropathy, unspecified: Secondary | ICD-10-CM | POA: Diagnosis not present

## 2019-09-05 DIAGNOSIS — R2681 Unsteadiness on feet: Secondary | ICD-10-CM | POA: Diagnosis not present

## 2019-09-05 DIAGNOSIS — E785 Hyperlipidemia, unspecified: Secondary | ICD-10-CM | POA: Diagnosis not present

## 2019-09-05 DIAGNOSIS — R5381 Other malaise: Secondary | ICD-10-CM | POA: Diagnosis not present

## 2019-09-05 DIAGNOSIS — J449 Chronic obstructive pulmonary disease, unspecified: Secondary | ICD-10-CM | POA: Diagnosis not present

## 2019-09-08 DIAGNOSIS — F419 Anxiety disorder, unspecified: Secondary | ICD-10-CM | POA: Diagnosis not present

## 2019-09-08 DIAGNOSIS — E785 Hyperlipidemia, unspecified: Secondary | ICD-10-CM | POA: Diagnosis not present

## 2019-09-08 DIAGNOSIS — R2681 Unsteadiness on feet: Secondary | ICD-10-CM | POA: Diagnosis not present

## 2019-09-08 DIAGNOSIS — J449 Chronic obstructive pulmonary disease, unspecified: Secondary | ICD-10-CM | POA: Diagnosis not present

## 2019-09-08 DIAGNOSIS — G629 Polyneuropathy, unspecified: Secondary | ICD-10-CM | POA: Diagnosis not present

## 2019-09-08 DIAGNOSIS — R5381 Other malaise: Secondary | ICD-10-CM | POA: Diagnosis not present

## 2019-09-13 DIAGNOSIS — R5381 Other malaise: Secondary | ICD-10-CM | POA: Diagnosis not present

## 2019-09-13 DIAGNOSIS — F419 Anxiety disorder, unspecified: Secondary | ICD-10-CM | POA: Diagnosis not present

## 2019-09-13 DIAGNOSIS — J449 Chronic obstructive pulmonary disease, unspecified: Secondary | ICD-10-CM | POA: Diagnosis not present

## 2019-09-13 DIAGNOSIS — E785 Hyperlipidemia, unspecified: Secondary | ICD-10-CM | POA: Diagnosis not present

## 2019-09-13 DIAGNOSIS — R2681 Unsteadiness on feet: Secondary | ICD-10-CM | POA: Diagnosis not present

## 2019-09-13 DIAGNOSIS — G629 Polyneuropathy, unspecified: Secondary | ICD-10-CM | POA: Diagnosis not present

## 2019-09-15 DIAGNOSIS — J449 Chronic obstructive pulmonary disease, unspecified: Secondary | ICD-10-CM | POA: Diagnosis not present

## 2019-09-15 DIAGNOSIS — E785 Hyperlipidemia, unspecified: Secondary | ICD-10-CM | POA: Diagnosis not present

## 2019-09-15 DIAGNOSIS — G629 Polyneuropathy, unspecified: Secondary | ICD-10-CM | POA: Diagnosis not present

## 2019-09-15 DIAGNOSIS — R5381 Other malaise: Secondary | ICD-10-CM | POA: Diagnosis not present

## 2019-09-15 DIAGNOSIS — R2681 Unsteadiness on feet: Secondary | ICD-10-CM | POA: Diagnosis not present

## 2019-09-15 DIAGNOSIS — F419 Anxiety disorder, unspecified: Secondary | ICD-10-CM | POA: Diagnosis not present

## 2019-09-19 ENCOUNTER — Telehealth: Payer: Self-pay | Admitting: Pulmonary Disease

## 2019-09-19 DIAGNOSIS — R5381 Other malaise: Secondary | ICD-10-CM | POA: Diagnosis not present

## 2019-09-19 DIAGNOSIS — R2681 Unsteadiness on feet: Secondary | ICD-10-CM | POA: Diagnosis not present

## 2019-09-19 DIAGNOSIS — E785 Hyperlipidemia, unspecified: Secondary | ICD-10-CM | POA: Diagnosis not present

## 2019-09-19 DIAGNOSIS — F419 Anxiety disorder, unspecified: Secondary | ICD-10-CM | POA: Diagnosis not present

## 2019-09-19 DIAGNOSIS — G629 Polyneuropathy, unspecified: Secondary | ICD-10-CM | POA: Diagnosis not present

## 2019-09-19 DIAGNOSIS — J449 Chronic obstructive pulmonary disease, unspecified: Secondary | ICD-10-CM | POA: Diagnosis not present

## 2019-09-19 NOTE — Telephone Encounter (Signed)
Call returned to [place] with [facility], she just wanted to update [place] regarding the patient. States her HR is running 48-52 and her BP is elevated. They called her PCP as well and they have called her in something to address her BP. [facility] nurse aware we are not PCP just wanted to keep [place] updated.   Will route to PM as FYI.   Nothing further needed at this time.

## 2019-09-21 DIAGNOSIS — R2681 Unsteadiness on feet: Secondary | ICD-10-CM | POA: Diagnosis not present

## 2019-09-21 DIAGNOSIS — J449 Chronic obstructive pulmonary disease, unspecified: Secondary | ICD-10-CM | POA: Diagnosis not present

## 2019-09-21 DIAGNOSIS — R5381 Other malaise: Secondary | ICD-10-CM | POA: Diagnosis not present

## 2019-09-21 DIAGNOSIS — F419 Anxiety disorder, unspecified: Secondary | ICD-10-CM | POA: Diagnosis not present

## 2019-09-21 DIAGNOSIS — E785 Hyperlipidemia, unspecified: Secondary | ICD-10-CM | POA: Diagnosis not present

## 2019-09-21 DIAGNOSIS — G629 Polyneuropathy, unspecified: Secondary | ICD-10-CM | POA: Diagnosis not present

## 2019-10-18 DIAGNOSIS — R634 Abnormal weight loss: Secondary | ICD-10-CM | POA: Diagnosis not present

## 2019-10-18 DIAGNOSIS — R110 Nausea: Secondary | ICD-10-CM | POA: Diagnosis not present

## 2019-10-18 DIAGNOSIS — R1032 Left lower quadrant pain: Secondary | ICD-10-CM | POA: Diagnosis not present

## 2019-10-19 ENCOUNTER — Other Ambulatory Visit: Payer: Self-pay | Admitting: Physician Assistant

## 2019-10-19 DIAGNOSIS — R1032 Left lower quadrant pain: Secondary | ICD-10-CM | POA: Diagnosis not present

## 2019-10-24 ENCOUNTER — Encounter (HOSPITAL_COMMUNITY): Payer: Self-pay | Admitting: *Deleted

## 2019-10-24 ENCOUNTER — Emergency Department (HOSPITAL_COMMUNITY)
Admission: EM | Admit: 2019-10-24 | Discharge: 2019-10-24 | Disposition: A | Discharge status: Home / Self Care | Payer: Medicare Other | Attending: Emergency Medicine | Admitting: Emergency Medicine

## 2019-10-24 ENCOUNTER — Emergency Department (HOSPITAL_COMMUNITY): Payer: Medicare Other

## 2019-10-24 ENCOUNTER — Other Ambulatory Visit: Payer: Self-pay

## 2019-10-24 DIAGNOSIS — K5900 Constipation, unspecified: Secondary | ICD-10-CM

## 2019-10-24 DIAGNOSIS — Z7982 Long term (current) use of aspirin: Secondary | ICD-10-CM | POA: Diagnosis not present

## 2019-10-24 DIAGNOSIS — R1110 Vomiting, unspecified: Secondary | ICD-10-CM | POA: Diagnosis not present

## 2019-10-24 DIAGNOSIS — Z96651 Presence of right artificial knee joint: Secondary | ICD-10-CM | POA: Insufficient documentation

## 2019-10-24 DIAGNOSIS — Z79899 Other long term (current) drug therapy: Secondary | ICD-10-CM | POA: Diagnosis not present

## 2019-10-24 DIAGNOSIS — R1084 Generalized abdominal pain: Secondary | ICD-10-CM | POA: Diagnosis present

## 2019-10-24 DIAGNOSIS — J449 Chronic obstructive pulmonary disease, unspecified: Secondary | ICD-10-CM | POA: Diagnosis not present

## 2019-10-24 DIAGNOSIS — Z87891 Personal history of nicotine dependence: Secondary | ICD-10-CM | POA: Diagnosis not present

## 2019-10-24 DIAGNOSIS — Z96641 Presence of right artificial hip joint: Secondary | ICD-10-CM | POA: Diagnosis not present

## 2019-10-24 DIAGNOSIS — Z96642 Presence of left artificial hip joint: Secondary | ICD-10-CM | POA: Diagnosis not present

## 2019-10-24 LAB — CBC
HCT: 42.1 % (ref 36.0–46.0)
Hemoglobin: 13.9 g/dL (ref 12.0–15.0)
MCH: 28.9 pg (ref 26.0–34.0)
MCHC: 33 g/dL (ref 30.0–36.0)
MCV: 87.5 fL (ref 80.0–100.0)
Platelets: 264 10*3/uL (ref 150–400)
RBC: 4.81 MIL/uL (ref 3.87–5.11)
RDW: 12.8 % (ref 11.5–15.5)
WBC: 12 10*3/uL — ABNORMAL HIGH (ref 4.0–10.5)
nRBC: 0 % (ref 0.0–0.2)

## 2019-10-24 LAB — COMPREHENSIVE METABOLIC PANEL
ALT: 10 U/L (ref 0–44)
AST: 19 U/L (ref 15–41)
Albumin: 3.7 g/dL (ref 3.5–5.0)
Alkaline Phosphatase: 109 U/L (ref 38–126)
Anion gap: 11 (ref 5–15)
BUN: 18 mg/dL (ref 8–23)
CO2: 27 mmol/L (ref 22–32)
Calcium: 9 mg/dL (ref 8.9–10.3)
Chloride: 102 mmol/L (ref 98–111)
Creatinine, Ser: 0.69 mg/dL (ref 0.44–1.00)
GFR calc Af Amer: >60 mL/min (ref >60)
GFR calc non Af Amer: >60 mL/min (ref >60)
Glucose, Bld: 134 mg/dL — ABNORMAL HIGH (ref 70–99)
Potassium: 3.2 mmol/L — ABNORMAL LOW (ref 3.5–5.1)
Sodium: 140 mmol/L (ref 135–145)
Total Bilirubin: 0.8 mg/dL (ref 0.3–1.2)
Total Protein: 6.7 g/dL (ref 6.5–8.1)

## 2019-10-24 LAB — LIPASE, BLOOD: Lipase: 25 U/L (ref 11–51)

## 2019-10-24 MED ORDER — ONDANSETRON 4 MG PO TBDP: 4.0000 mg | ORAL_TABLET | Freq: Three times a day (TID) | ORAL | 0 refills | Status: DC | PRN

## 2019-10-24 MED ORDER — IOHEXOL 300 MG/ML  SOLN
100.0000 mL | Freq: Once | INTRAMUSCULAR | Status: AC | PRN
  Administered 2019-10-24: 100 mL via INTRAVENOUS

## 2019-10-24 MED ORDER — SODIUM CHLORIDE (PF) 0.9 % IJ SOLN
INTRAMUSCULAR | Status: AC
  Filled 2019-10-24: qty 50

## 2019-10-24 MED ORDER — ONDANSETRON HCL 4 MG/2ML IJ SOLN
4.0000 mg | Freq: Once | INTRAMUSCULAR | Status: AC
  Administered 2019-10-24: 4 mg via INTRAVENOUS
  Filled 2019-10-24: qty 2

## 2019-10-24 MED ORDER — POLYETHYLENE GLYCOL 3350 17 G PO PACK: 17.0000 g | PACK | Freq: Two times a day (BID) | ORAL | 0 refills | Status: AC

## 2019-10-24 NOTE — ED Triage Notes (Signed)
Pt sent by PCP for possible bowel obstruction or diverticulitis. Pt states she feels like she has to have bowel movement but doesn't go, and has loose stool. Pain is relieved by heating pad.

## 2019-10-24 NOTE — ED Provider Notes (Signed)
[facility] DEPT Provider Note   CSN: [id number] Arrival date & time: 10/24/19  1724     History Chief Complaint  Patient presents with  . Abdominal Pain    Deborah Waller is a 78 y.o. female.  Presents to the emergency room chief complaint abdominal pain.  Patient states for the past couple months she has been having some generalized abdominal discomfort with intermittent.  Constipation.  States that her gastroenterologist has diagnosed her with IBS.  Over the past couple days her abdominal pain had worsened and she had an episode of vomiting yesterday.  No episodes of vomiting today.  Last bowel movement was a couple days ago, no blood.  Passing gas.   HPI     Past Medical History:  Diagnosis Date  . Anxiety   . Neuropathy of both feet     Patient Active Problem List   Diagnosis Date Noted  . Shortness of breath 08/24/2019  . Physical deconditioning 08/24/2019  . Anginal equivalent ([place]) 06/30/2019  . LVH (left ventricular hypertrophy) 06/30/2019  . Leg swelling 05/09/2019  . COPD (chronic obstructive pulmonary disease) ([place]) 04/27/2019  . Hyperlipidemia 02/09/2017  . PVD (peripheral vascular disease) ([place]) 02/09/2017    Past Surgical History:  Procedure Laterality Date  . ABDOMINAL HYSTERECTOMY  1972  . ANKLE RECONSTRUCTION Left 12/2007  . BREAST EXCISIONAL BIOPSY Right   . BREAST LUMPECTOMY Left   . BUNIONECTOMY Left 2002, 2003  . CATARACT EXTRACTION Left 11/2001  . CATARACT EXTRACTION Right 10/2001  . GALLBLADDER SURGERY  2000  . leg bypass- left  03/04/2011  . leg bypass-right   04/22/2011  . LIPOMA EXCISION  07/18/2014  . OOPHORECTOMY  1995  . REPLACEMENT TOTAL KNEE Right 12/08/2011  . TOE SURGERY Left 2010  . TOTAL HIP ARTHROPLASTY Right 11/13/2008  . TOTAL HIP ARTHROPLASTY Left 06/2005     OB History   No obstetric history on file.     Family History  Problem Relation Age of Onset  . Allergies Sister      Social History   Tobacco Use  . Smoking status: Former Smoker    Packs/day: 1.00    Years: 33.00    Pack years: 33.00    Types: Cigarettes    Quit date: 11/10/1987    Years since quitting: 31.9  . Smokeless tobacco: Never Used  Substance Use Topics  . Alcohol use: No  . Drug use: No    Home Medications Prior to Admission medications   Medication Sig Start Date End Date Taking? Authorizing Provider  albuterol (PROVENTIL HFA;VENTOLIN HFA) 108 (90 Base) MCG/ACT inhaler Inhale 2 puffs into the lungs every 6 (six) hours as needed for wheezing or shortness of breath. 10/04/18   Marshell Garfinkel, MD  aspirin EC 81 MG tablet Take 1 tablet (81 mg total) by mouth daily. 04/27/19   Buford Dresser, MD  atorvastatin (LIPITOR) 40 MG tablet Take 1 tablet (40 mg total) by mouth daily. 07/27/19   Buford Dresser, MD  Blood Glucose Monitoring Suppl (ACCU-CHEK AVIVA PLUS) w/Device KIT USE SEVERAL TIMES A WEEK UTD 02/08/17   [provider]  Cholecalciferol (VITAMIN D) 2000 units CAPS Take 2,000 Units by mouth daily.    [provider]  dicyclomine (BENTYL) 20 MG tablet TK 1 T PO QID PRN 12/28/17   [provider]  metoprolol succinate (TOPROL-XL) 25 MG 24 hr tablet Take 1 tablet (25 mg total) by mouth daily. Take with or immediately following a meal.  06/30/19 09/28/19  Buford Dresser, MD  omeprazole (PRILOSEC) 20 MG capsule Take 1 capsule by mouth daily. 03/23/19   [provider]  ondansetron (ZOFRAN-ODT) 4 MG disintegrating tablet Take 1 tablet (4 mg total) by mouth every 8 (eight) hours as needed for nausea or vomiting. 10/24/19   Lucrezia Starch, MD  polyethylene glycol (MIRALAX / GLYCOLAX) 17 g packet Take 17 g by mouth 2 (two) times daily. 10/24/19 11/23/19  Lucrezia Starch, MD  pregabalin (LYRICA) 150 MG capsule Take 150 mg by mouth 2 (two) times daily.    [provider]  TRINTELLIX 10 MG TABS tablet Take 1 tablet by mouth daily.  04/08/19   [provider]    Allergies    Oxycodone  Review of Systems   Review of Systems  Constitutional: Negative for chills and fever.  HENT: Negative for ear pain and sore throat.   Eyes: Negative for pain and visual disturbance.  Respiratory: Negative for cough and shortness of breath.   Cardiovascular: Negative for chest pain and palpitations.  Gastrointestinal: Positive for abdominal pain and vomiting.  Genitourinary: Negative for dysuria and hematuria.  Musculoskeletal: Negative for arthralgias and back pain.  Skin: Negative for color change and rash.  Neurological: Negative for seizures and syncope.  All other systems reviewed and are negative.   Physical Exam Updated Vital Signs BP (!) 159/66   Pulse 76   Temp 98.1 F (36.7 C) (Oral)   Resp 18   SpO2 97%   Physical Exam Vitals and nursing note reviewed.  Constitutional:      General: She is not in acute distress.    Appearance: She is well-developed.  HENT:     Head: Normocephalic and atraumatic.  Eyes:     Conjunctiva/sclera: Conjunctivae normal.  Cardiovascular:     Rate and Rhythm: Normal rate and regular rhythm.     Heart sounds: No murmur.  Pulmonary:     Effort: Pulmonary effort is normal. No respiratory distress.     Breath sounds: Normal breath sounds.  Abdominal:     Palpations: Abdomen is soft.     Tenderness: There is no abdominal tenderness.  Musculoskeletal:     Cervical back: Neck supple.  Skin:    General: Skin is warm and dry.     Capillary Refill: Capillary refill takes less than 2 seconds.  Neurological:     General: No focal deficit present.     Mental Status: She is alert and oriented to person, place, and time.  Psychiatric:        Mood and Affect: Mood normal.        Behavior: Behavior normal.     ED Results / Procedures / Treatments   Labs (all labs ordered are listed, but only abnormal results are displayed) Labs Reviewed  COMPREHENSIVE METABOLIC PANEL -  Abnormal; Notable for the following components:      Result Value   Potassium 3.2 (*)    Glucose, Bld 134 (*)    All other components within normal limits  CBC - Abnormal; Notable for the following components:   WBC 12.0 (*)    All other components within normal limits  LIPASE, BLOOD    EKG None  Radiology CT Abdomen Pelvis W Contrast  Result Date: 10/24/2019 CLINICAL DATA:  Acute abdominal pain, generalized. EXAM: CT ABDOMEN AND PELVIS WITH CONTRAST TECHNIQUE: Multidetector CT imaging of the abdomen and pelvis was performed using the standard protocol following bolus administration of intravenous contrast. CONTRAST:  138m OMNIPAQUE IOHEXOL 300 MG/ML  SOLN COMPARISON:  12/15/2017 FINDINGS: Lower chest: Signs of basilar atelectasis on the right. No signs of dense consolidation or pleural effusion. Hepatobiliary: Liver is normal. Mild distension of the biliary tree following cholecystectomy. Pancreas: Pancreas is unremarkable. Spleen: Spleen is normal without focal lesion. Adrenals/Urinary Tract: Adrenal glands are normal. The kidneys enhance symmetrically without signs of focal lesion. Bladder is grossly normal but obscured by streak artifact from hip arthroplasty changes. Stomach/Bowel: Stomach is unremarkable. Small bowel is of normal caliber but with mild distention of distal small bowel, no discrete transition or signs of obstruction. Bowel wall enhancement is preserved. SMV is patent. The appendix is not visualized, presumably post appendectomy. Vascular/Lymphatic: Signs of atherosclerotic change throughout the abdominal aorta tracking into branch vessels in the abdomen with similar appearance to prior studies. No signs of adenopathy in the abdomen or pelvis. Signs of femoropopliteal bypass. Large volume of stool in the rectum. Measuring approximately 7 x 6 cm. Reproductive: Post hysterectomy. Other: Small volume ascites in the low abdomen. Musculoskeletal: No signs of acute bone finding or  destructive bone process. Post bilateral hip arthroplasty changes. IMPRESSION: 1. Large volume of stool in the rectum. Correlate with signs of fecal impaction. This can lead to stercoral colitis though minimal perirectal stranding is noted. 2. Signs of enteritis, nonspecific appearance, may be infectious, a secondary phenomenon or related to bowel ischemia though bowel enhancement is maintained and vessels are grossly patent. Correlation with lactate is suggested in this patient with vascular disease. 3. Appendix is not visualized.  Correlate with surgical history. 4. Signs of femoropopliteal bypass. Is Aortic Atherosclerosis (ICD10-I70.0). Electronically Signed   By: GZetta BillsM.D.   On: 10/24/2019 19:23    Procedures Procedures (including critical care time)  Medications Ordered in ED Medications  sodium chloride (PF) 0.9 % injection (has no administration in time range)  ondansetron (ZOFRAN) injection 4 mg (4 mg Intravenous Given 10/24/19 1750)  iohexol (OMNIPAQUE) 300 MG/ML solution 100 mL (100 mLs Intravenous Contrast Given 10/24/19 1845)    ED Course  I have reviewed the triage vital signs and the nursing notes.  Pertinent labs & imaging results that were available during my care of the patient were reviewed by me and considered in my medical decision making (see chart for details).  Clinical Course as of Oct 23 2038  Tue Oct 24, 2019  2030 Complete initial assessment, patient has soft abdomen, tolerating p.o., will discharge   [RD]    Clinical Course User Index [RD] DLucrezia Starch MD   MDM Rules/Calculators/A&P                      78year old lady presents to ER with chief complaint of abdominal pain, had episode of vomiting yesterday.  Here patient is well-appearing, no distress, soft abdomen.  CT abdomen pelvis negative for obstruction, no diverticulitis.  Did note significant stool in rectum.  Recommended bowel regimen and recheck with her gastroenterologist.   Tolerating p.o. without difficulty.    After the discussed management above, the patient was determined to be safe for discharge.  The patient was in agreement with this plan and all questions regarding their care were answered.  ED return precautions were discussed and the patient will return to the ED with any significant worsening of condition.  Final Clinical Impression(Deborah) / ED Diagnoses Final diagnoses:  Constipation, unspecified constipation type    Rx / DC Orders ED Discharge Orders  Ordered    polyethylene glycol (MIRALAX / GLYCOLAX) 17 g packet  2 times daily     10/24/19 2033    ondansetron (ZOFRAN-ODT) 4 MG disintegrating tablet  Every 8 hours PRN     10/24/19 2033           Lucrezia Starch, MD 10/25/19 0006

## 2019-10-24 NOTE — Discharge Instructions (Addendum)
Recommend calling your gastroenterologist and/or your primary doctor regarding the symptoms you are experiencing today.  Recommend following the bowel regimen of MiraLAX as discussed - twice daily MiraLAX for at least 1 week.  Additionally, can take Zofran as needed for nausea.  If you find your abdominal pain worsens, you develop recurrent vomiting, blood in stool, fevers or other new concerning symptom, please return to ER for reassessment at that time.

## 2019-10-24 NOTE — ED Provider Notes (Signed)
MSE was initiated and I personally evaluated the patient and placed orders (if any) at  5:46 PM on October 24, 2019.  The patient appears stable so that the remainder of the MSE may be completed by another provider.  78 year old female who presents for evaluation of abdominal pain.  She states this pain has been ongoing for several weeks but has worsened over the last week.  About 2 days ago, she started having nonbloody, nonbilious vomiting.  She also reports difficulty having bowel movements.  She states that today, all she had a small liquid stools with some bright red blood noted.  She called her GI doctor who sent her to the emergency department for further evaluation.  Abdomen is soft, nondistended.  Diffuse tenderness with no focal point.  No rigidity, guarding.  Portions of this note were generated with [employer]. Dictation errors may occur despite best attempts at proofreading.     Volanda Napoleon, PA-C 10/24/19 [place], Ankit, MD 10/30/19 [id number]

## 2019-10-27 ENCOUNTER — Other Ambulatory Visit: Payer: Medicare Other

## 2019-11-19 ENCOUNTER — Ambulatory Visit: Payer: Medicare Other | Attending: Internal Medicine

## 2019-11-19 DIAGNOSIS — Z23 Encounter for immunization: Secondary | ICD-10-CM

## 2019-11-19 NOTE — Progress Notes (Signed)
   Covid-19 Vaccination Clinic  Name:  Shaleena Sillers    MRN: [id number] DOB: [birth year]  11/19/2019  Ms. Bachar was observed post Covid-19 immunization for 15 minutes without incidence. She was provided with Vaccine Information Sheet and instruction to access the V-Safe system.   Ms. Flikkema was instructed to call 911 with any severe reactions post vaccine: Marland Kitchen Difficulty breathing  . Swelling of your face and throat  . A fast heartbeat  . A bad rash all over your body  . Dizziness and weakness    Immunizations Administered    Name Date Dose VIS Date Route   Pfizer COVID-19 Vaccine 11/19/2019 12:37 PM 0.3 mL 10/20/2019 Intramuscular   Manufacturer: [place]   Lot: [id number]   [place]: [id number]

## 2019-12-10 ENCOUNTER — Ambulatory Visit: Payer: Medicare Other

## 2019-12-10 ENCOUNTER — Ambulatory Visit: Payer: Medicare Other | Attending: Internal Medicine

## 2019-12-10 DIAGNOSIS — Z23 Encounter for immunization: Secondary | ICD-10-CM | POA: Insufficient documentation

## 2019-12-10 NOTE — Progress Notes (Signed)
   Covid-19 Vaccination Clinic  Name:  Minta Cana    MRN: [id number] DOB: 05-27-1941  12/10/2019  Ms. Delatorre was observed post Covid-19 immunization for 15 minutes without incidence. She was provided with Vaccine Information Sheet and instruction to access the V-Safe system.   Ms. Campuzano was instructed to call 911 with any severe reactions post vaccine: Marland Kitchen Difficulty breathing  . Swelling of your face and throat  . A fast heartbeat  . A bad rash all over your body  . Dizziness and weakness    Immunizations Administered    Name Date Dose VIS Date Route   Pfizer COVID-19 Vaccine 12/10/2019 11:16 AM 0.3 mL 10/20/2019 Intramuscular   Manufacturer: [place]   Lot: [id number]   [place]: [id number]

## 2019-12-16 ENCOUNTER — Ambulatory Visit: Payer: Medicare Other

## 2019-12-24 NOTE — Progress Notes (Signed)
@Patient  ID: Deborah Waller, female    DOB: 1940/12/08, 79 y.o.   MRN: [id number]  Chief Complaint  Patient presents with  . Follow-up    F/U for COPD. States her breathing has been great since last visit. Only has SOB episodes with heat.     Referring provider: Donald Prose, MD  HPI:  79 year old female former smoker followed in our office for COPD/emphysema.  Patient has been tried on maintenance inhalers in the past and has found no improvement.  Patient reports symptomatic improvement when using rescue inhaler.  PMH: Hyperlipidemia, anxiety, claustrophobia Smoker/ Smoking History: Former Smoker. Quit 1989. 33 pack years.  Maintenance:  None, tried Breo and Symbicort in past with no improvement Pt of: Dr. Vaughan Browner  12/25/2019  - Visit   79 year old female former smoker followed in our office for COPD.  Patient reports that her breathing has been great since last office visit 4 months ago.  Patient has been trialed on maintenance inhalers in the past but has been found to have no clinical improvement so she is currently maintained simply on a rescue inhaler.  Patient reports that she is doing well today.  She only reports one episode where her shortness of breath was triggered by her grocery store having a very high temperature inside.  Her main triggers to her shortness of breath or heat.  She is only had to use her rescue Hailer 1-2 times a month.  Overall she is also increased her mobility.  She completed a course of home health physical activity.  Questionaires / Pulmonary Flowsheets:   MMRC: mMRC Dyspnea Scale mMRC Score  12/25/2019 1  08/24/2019 1   Tests:   Imaging Data from previous pulmonologist reviewed including images (except CT scan from 2015) CT scan  07/30/14-. Small lung nodules CT scan 02/07/15- Small pulmonary nodule stable. CT scan 07/18/15-3 to 4 mm pulmonary nodule in the right upper lobe. 2-3 mm calcified nodule in the right lower lobe. 2-3 mm calcified  granuloma in the left lower lobe. Subsegmental atelectasis in the right lower lobe. Diffuse emphysematous changes.  Data from [facility] CT scan from here 02/15/17-4 mm right middle lobe nodule, 3 mm right lower lobe nodule, 2 mm right lower lobe nodule, 1-2 mm right lower lobe nodule, 1-2 mm left lower lobe nodule.   CT abdomen pelvis  12/25/17-stable lower lobe calcified nodules.  Lung images are otherwise normal  05/22/2019-CT chest without contrast-mild/moderate centrilobular emphysema again seen, stable mild right lower lobe scarring, tiny calcified granulomas are again seen in both lungs, no suspicious pulmonary nodules or masses are identified, no evidence of pulmonary infiltrate or pleural effusion  PFTs PFT 2016 -mild airflow obstruction, moderate diffusion abnormality as per pulmonologist note. Actual study not sent.  PFTs 02/22/17 FVC 2.62 [89%), FEV1 1.94 (88%), F/F 74, TLC 70%, DLCO 54% Mild obstruction with bronchodilator response, mild restriction with moderate diffusion defect.   FENO:  No results found for: NITRICOXIDE  PFT: PFT Results Latest Ref Rng & Units 02/22/2017  FVC-Pre L 2.14  FVC-Predicted Pre % 73  FVC-Post L 2.62  FVC-Predicted Post % 89  Pre FEV1/FVC % % 77  Post FEV1/FCV % % 74  FEV1-Pre L 1.66  FEV1-Predicted Pre % 75  FEV1-Post L 1.94  DLCO UNC% % 54  DLCO COR %Predicted % 69  TLC L 3.66  TLC % Predicted % 70  RV % Predicted % 47    WALK:  SIX MIN WALK 08/24/2019  Supplimental Oxygen during  Test? (L/min) No  Tech Comments: Patient was able to complete 1 lap. Was able to hold a conversation without stopping. Patient was a bit unstead during walk due to her neuropathy but denied any SOB or chest pain. O2 was not needed during or after walk.    Imaging: No results found.  Lab Results:  Imaging Data from previous pulmonologist reviewed including images (except CT scan from 2015) CT scan  07/30/14-. Small lung nodules CT scan 02/07/15- Small  pulmonary nodule stable. CT scan 07/18/15-3 to 4 mm pulmonary nodule in the right upper lobe. 2-3 mm calcified nodule in the right lower lobe. 2-3 mm calcified granuloma in the left lower lobe. Subsegmental atelectasis in the right lower lobe. Diffuse emphysematous changes.  Data from [facility] CT scan from here 02/15/17-4 mm right middle lobe nodule, 3 mm right lower lobe nodule, 2 mm right lower lobe nodule, 1-2 mm right lower lobe nodule, 1-2 mm left lower lobe nodule.   CT abdomen pelvis  12/25/17-stable lower lobe calcified nodules.  Lung images are otherwise normal  05/22/2019-CT chest without contrast-mild/moderate centrilobular emphysema again seen, stable mild right lower lobe scarring, tiny calcified granulomas are again seen in both lungs, no suspicious pulmonary nodules or masses are identified, no evidence of pulmonary infiltrate or pleural effusion  PFTs PFT 2016 -mild airflow obstruction, moderate diffusion abnormality as per pulmonologist note. Actual study not sent.  PFTs 02/22/17 FVC 2.62 [89%), FEV1 1.94 (88%), F/F 74, TLC 70%, DLCO 54% Mild obstruction with bronchodilator response, mild restriction with moderate diffusion defect.   CBC    Component Value Date/Time   WBC 12.0 (H) 10/24/2019 1750   RBC 4.81 10/24/2019 1750   HGB 13.9 10/24/2019 1750   HCT 42.1 10/24/2019 1750   PLT 264 10/24/2019 1750   MCV 87.5 10/24/2019 1750   MCH 28.9 10/24/2019 1750   MCHC 33.0 10/24/2019 1750   RDW 12.8 10/24/2019 1750   LYMPHSABS 3.9 03/24/2019 1500   MONOABS 0.8 03/24/2019 1500   EOSABS 0.2 03/24/2019 1500   BASOSABS 0.0 03/24/2019 1500    BMET    Component Value Date/Time   NA 140 10/24/2019 1750   NA 142 07/19/2019 1142   K 3.2 (L) 10/24/2019 1750   CL 102 10/24/2019 1750   CO2 27 10/24/2019 1750   GLUCOSE 134 (H) 10/24/2019 1750   BUN 18 10/24/2019 1750   BUN 17 07/19/2019 1142   CREATININE 0.69 10/24/2019 1750   CREATININE 0.89 03/24/2019 1526   CALCIUM 9.0  10/24/2019 1750   GFRNONAA >60 10/24/2019 1750   GFRAA >60 10/24/2019 1750    BNP No results found for: BNP  ProBNP No results found for: PROBNP  Specialty Problems      Pulmonary Problems   COPD (chronic obstructive pulmonary disease) (HCC)    PFTs 02/22/17 FVC 2.62 [89%), FEV1 1.94 (88%), F/F 74, TLC 70%, DLCO 54% Mild obstruction with bronchodilator response, mild restriction with moderate diffusion defect.  05/22/2019-CT chest without contrast-mild/moderate centrilobular emphysema again seen, stable mild right lower lobe scarring, tiny calcified granulomas are again seen in both lungs, no suspicious pulmonary nodules or masses are identified, no evidence of pulmonary infiltrate or pleural effusion       Shortness of breath      Allergies  Allergen Reactions  . Oxycodone     Childhood reaction, does not remember the reaction.     Immunization History  Administered Date(s) Administered  . Influenza, High Dose Seasonal PF 08/23/2018, 08/05/2019  .  Influenza-Unspecified 09/10/2015, 08/08/2017  . PFIZER SARS-COV-2 Vaccination 11/19/2019, 12/10/2019  . Pneumococcal Conjugate-13 11/09/2014  . Pneumococcal Polysaccharide-23 11/09/2012  . Zoster 11/09/2012    Past Medical History:  Diagnosis Date  . Anxiety   . Neuropathy of both feet     Tobacco History: Social History   Tobacco Use  Smoking Status Former Smoker  . Packs/day: 1.00  . Years: 33.00  . Pack years: 33.00  . Types: Cigarettes  . Quit date: 11/10/1987  . Years since quitting: 32.1  Smokeless Tobacco Never Used   Counseling given: Yes   Continue to not smoke  Outpatient Encounter Medications as of 12/25/2019  Medication Sig  . albuterol (PROVENTIL HFA;VENTOLIN HFA) 108 (90 Base) MCG/ACT inhaler Inhale 2 puffs into the lungs every 6 (six) hours as needed for wheezing or shortness of breath.  Marland Kitchen aspirin EC 81 MG tablet Take 1 tablet (81 mg total) by mouth daily.  Marland Kitchen atorvastatin (LIPITOR) 40 MG  tablet Take 1 tablet (40 mg total) by mouth daily.  . Blood Glucose Monitoring Suppl (ACCU-CHEK AVIVA PLUS) w/Device KIT USE SEVERAL TIMES A WEEK UTD  . Cholecalciferol (VITAMIN D) 2000 units CAPS Take 2,000 Units by mouth daily.  Marland Kitchen dicyclomine (BENTYL) 20 MG tablet TK 1 T PO QID PRN  . omeprazole (PRILOSEC) 20 MG capsule Take 1 capsule by mouth daily.  . ondansetron (ZOFRAN-ODT) 4 MG disintegrating tablet Take 1 tablet (4 mg total) by mouth every 8 (eight) hours as needed for nausea or vomiting.  . pregabalin (LYRICA) 150 MG capsule Take 150 mg by mouth 2 (two) times daily.  . TRINTELLIX 10 MG TABS tablet Take 1 tablet by mouth daily.  . metoprolol succinate (TOPROL-XL) 25 MG 24 hr tablet Take 1 tablet (25 mg total) by mouth daily. Take with or immediately following a meal.  . [DISCONTINUED] atorvastatin (LIPITOR) 20 MG tablet Take 20 mg by mouth daily.   No facility-administered encounter medications on file as of 12/25/2019.    Review of Systems  Review of Systems  Constitutional: Negative for activity change, fatigue and fever.  HENT: Negative for sinus pressure, sinus pain and sore throat.   Respiratory: Negative for cough, shortness of breath and wheezing.   Cardiovascular: Negative for chest pain and palpitations.  Gastrointestinal: Negative for diarrhea, nausea and vomiting.  Musculoskeletal: Negative for arthralgias.  Neurological: Negative for dizziness.  Psychiatric/Behavioral: Negative for sleep disturbance. The patient is not nervous/anxious.      Physical Exam  BP 124/74 (BP Location: Left Arm, Patient Position: Sitting, Cuff Size: Normal)   Pulse 82   Temp (!) 97 F (36.1 C) (Temporal)   Ht 5' 5"  (1.651 m)   Wt 146 lb 12.8 oz (66.6 kg)   SpO2 96% Comment: on RA  BMI 24.43 kg/m   Wt Readings from Last 5 Encounters:  12/25/19 146 lb 12.8 oz (66.6 kg)  08/24/19 150 lb 12.8 oz (68.4 kg)  06/30/19 151 lb (68.5 kg)  05/24/19 151 lb 12.8 oz (68.9 kg)  05/02/19 152  lb 9.6 oz (69.2 kg)    BMI Readings from Last 5 Encounters:  12/25/19 24.43 kg/m  08/24/19 25.09 kg/m  06/30/19 25.13 kg/m  05/24/19 22.42 kg/m  05/02/19 25.39 kg/m     Physical Exam Vitals and nursing note reviewed.  Constitutional:      General: She is not in acute distress.    Appearance: Normal appearance. She is normal weight.  HENT:     Head: Normocephalic and atraumatic.  Right Ear: Tympanic membrane, ear canal and external ear normal. There is no impacted cerumen.     Left Ear: Tympanic membrane, ear canal and external ear normal. There is no impacted cerumen.     Nose: Nose normal. No congestion.     Mouth/Throat:     Mouth: Mucous membranes are moist.     Pharynx: Oropharynx is clear.  Eyes:     Pupils: Pupils are equal, round, and reactive to light.  Cardiovascular:     Rate and Rhythm: Normal rate and regular rhythm.     Pulses: Normal pulses.     Heart sounds: Normal heart sounds. No murmur.  Pulmonary:     Breath sounds: Normal breath sounds. No decreased air movement. No decreased breath sounds, wheezing or rales.  Musculoskeletal:     Cervical back: Normal range of motion.  Skin:    General: Skin is warm and dry.     Capillary Refill: Capillary refill takes less than 2 seconds.  Neurological:     General: No focal deficit present.     Mental Status: She is alert and oriented to person, place, and time. Mental status is at baseline.     Gait: Gait abnormal.  Psychiatric:        Mood and Affect: Mood normal.        Behavior: Behavior normal.        Thought Content: Thought content normal.        Judgment: Judgment normal.     Assessment & Plan:   COPD (chronic obstructive pulmonary disease) ([place]) Patient doing well today Stable visit  Plan: Continue risk inhaler as needed for shortness of breath and wheezing Proceed forward with increasing physical mobility Use appropriate assistive devices such as a cane if you are unsteady on your  feet Follow-up with our office in 6 months    Return in about 6 months (around 06/23/2020), or if symptoms worsen or fail to improve, for Follow up with Wyn Quaker FNP-C.   Lauraine Rinne, NP 12/25/2019   This appointment required 28 minutes of patient care (this includes precharting, chart review, review of results, face-to-face care, etc.).

## 2019-12-25 ENCOUNTER — Encounter: Payer: Self-pay | Admitting: Pulmonary Disease

## 2019-12-25 ENCOUNTER — Ambulatory Visit (INDEPENDENT_AMBULATORY_CARE_PROVIDER_SITE_OTHER): Payer: Medicare Other | Admitting: Pulmonary Disease

## 2019-12-25 ENCOUNTER — Other Ambulatory Visit: Payer: Self-pay

## 2019-12-25 VITALS — BP 124/74 | HR 82 | Temp 97.0°F | Ht 65.0 in | Wt 146.8 lb

## 2019-12-25 DIAGNOSIS — J449 Chronic obstructive pulmonary disease, unspecified: Secondary | ICD-10-CM | POA: Diagnosis not present

## 2019-12-25 NOTE — Assessment & Plan Note (Signed)
Patient doing well today Stable visit  Plan: Continue risk inhaler as needed for shortness of breath and wheezing Proceed forward with increasing physical mobility Use appropriate assistive devices such as a cane if you are unsteady on your feet Follow-up with our office in 6 months

## 2019-12-25 NOTE — Patient Instructions (Addendum)
You were seen today by Lauraine Rinne, NP  for:   1. Chronic obstructive pulmonary disease, unspecified COPD type ([place])  Keep increasing your overall physical activity  Great work over the last 4 months!  Proud of you!  Note your daily symptoms > remember "red flags" for COPD:   >>>Increase in cough >>>increase in sputum production >>>increase in shortness of breath or activity  intolerance.   If you notice these symptoms, please call the office to be seen.    Follow Up:    Return in about 6 months (around 06/23/2020), or if symptoms worsen or fail to improve, for Follow up with Wyn Quaker FNP-C.   Please do your part to reduce the spread of COVID-19:      Reduce your risk of any infection  and COVID19 by using the similar precautions used for avoiding the common cold or flu:  Marland Kitchen Wash your hands often with soap and warm water for at least 20 seconds.  If soap and water are not readily available, use an alcohol-based hand sanitizer with at least 60% alcohol.  . If coughing or sneezing, cover your mouth and nose by coughing or sneezing into the elbow areas of your shirt or coat, into a tissue or into your sleeve (not your hands). Langley Gauss A MASK when in public  . Avoid shaking hands with others and consider head nods or verbal greetings only. . Avoid touching your eyes, nose, or mouth with unwashed hands.  . Avoid close contact with people who are sick. . Avoid places or events with large numbers of people in one location, like concerts or sporting events. . If you have some symptoms but not all symptoms, continue to monitor at home and seek medical attention if your symptoms worsen. . If you are having a medical emergency, call 911.   [place] / e-Visit: [email or web address]         [facility]: [facility]: [id number]                   [facility]: [id number]     It is flu season:   >>> Best ways to protect herself from the flu: Receive the yearly flu vaccine, practice good hand hygiene washing with soap and also using hand sanitizer when available, eat a nutritious meals, get adequate rest, hydrate appropriately   Please contact the office if your symptoms worsen or you have concerns that you are not improving.   Thank you for choosing [facility] for your healthcare, and for allowing [place] to partner with you on your healthcare journey. I am thankful to be able to provide care to you today.   Wyn Quaker FNP-C

## 2019-12-27 ENCOUNTER — Telehealth: Payer: Self-pay

## 2019-12-27 NOTE — Telephone Encounter (Signed)
Attempted to contact pt to rescheduled appointment or change to virtual due to weather forecast. Left message to call back.

## 2019-12-28 ENCOUNTER — Ambulatory Visit: Payer: Medicare Other | Admitting: Cardiology

## 2020-01-01 ENCOUNTER — Encounter: Payer: Self-pay | Admitting: Cardiology

## 2020-01-01 ENCOUNTER — Ambulatory Visit (INDEPENDENT_AMBULATORY_CARE_PROVIDER_SITE_OTHER): Payer: Medicare Other | Admitting: Cardiology

## 2020-01-01 ENCOUNTER — Other Ambulatory Visit: Payer: Self-pay

## 2020-01-01 VITALS — BP 152/79 | HR 65 | Temp 97.1°F | Ht 65.0 in | Wt 144.0 lb

## 2020-01-01 DIAGNOSIS — I700 Atherosclerosis of aorta: Secondary | ICD-10-CM | POA: Diagnosis not present

## 2020-01-01 DIAGNOSIS — R0789 Other chest pain: Secondary | ICD-10-CM | POA: Diagnosis not present

## 2020-01-01 DIAGNOSIS — I2510 Atherosclerotic heart disease of native coronary artery without angina pectoris: Secondary | ICD-10-CM | POA: Diagnosis not present

## 2020-01-01 DIAGNOSIS — E782 Mixed hyperlipidemia: Secondary | ICD-10-CM

## 2020-01-01 DIAGNOSIS — I517 Cardiomegaly: Secondary | ICD-10-CM | POA: Diagnosis not present

## 2020-01-01 NOTE — Progress Notes (Signed)
Cardiology Office Note:    Date:  01/01/2020   ID:  Deborah Waller, DOB 1941/06/01, MRN [id number]  PCP:  Donald Prose, MD  Cardiologist:  Buford Dresser, MD PhD  Referring MD: Donald Prose, MD   CC: follow up  History of Present Illness:    Deborah Waller (Pronouced ko-TONE) is a 79 y.o. female with a hx of PMH mild COPD, pulmondary nodules (followed by Dr. Vaughan Browner), PAD with prior bilateral bypass surgery followed by Dr. Lucky Cowboy, hyperlipidemia, reported hypertension but on no meds, former tobacco abuse, claustrophobia/anxiety, neuropathy. (Denies history of type II diabetes, last A1c 5.9) who is seen for follow up today. Initial consultation (virtual) with me on 04/27/19.  Today: Has struggled with GI issues since November 2020, has lost 10 lbs dealing with this. Reviewed her CT scan from 10/24/19 which noted concern for colitis. Still recovering.  Had left chest and back pain when pushing herself up from a seated position. Sharp pain, went through her chest to her back. Has occurred intermittently, not clearly exertional, no other associated symptoms.  Reviewed CT cardiac, prior echo. Reviewed her septal hypertrophy. She is doing well on the metoprolol, no issues.  Denies chest pain, shortness of breath at rest or with normal exertion. No PND, orthopnea, LE edema or unexpected weight gain. No syncope or palpitations.  Past Medical History:  Diagnosis Date  . Anxiety   . Neuropathy of both feet     Past Surgical History:  Procedure Laterality Date  . ABDOMINAL HYSTERECTOMY  1972  . ANKLE RECONSTRUCTION Left 12/2007  . BREAST EXCISIONAL BIOPSY Right   . BREAST LUMPECTOMY Left   . BUNIONECTOMY Left 2002, 2003  . CATARACT EXTRACTION Left 11/2001  . CATARACT EXTRACTION Right 10/2001  . GALLBLADDER SURGERY  2000  . leg bypass- left  03/04/2011  . leg bypass-right   04/22/2011  . LIPOMA EXCISION  07/18/2014  . OOPHORECTOMY  1995  . REPLACEMENT TOTAL KNEE Right  12/08/2011  . TOE SURGERY Left 2010  . TOTAL HIP ARTHROPLASTY Right 11/13/2008  . TOTAL HIP ARTHROPLASTY Left 06/2005    Current Medications: Current Outpatient Medications on File Prior to Visit  Medication Sig  . albuterol (PROVENTIL HFA;VENTOLIN HFA) 108 (90 Base) MCG/ACT inhaler Inhale 2 puffs into the lungs every 6 (six) hours as needed for wheezing or shortness of breath.  Marland Kitchen aspirin EC 81 MG tablet Take 1 tablet (81 mg total) by mouth daily.  Marland Kitchen atorvastatin (LIPITOR) 40 MG tablet Take 1 tablet (40 mg total) by mouth daily.  . Blood Glucose Monitoring Suppl (ACCU-CHEK AVIVA PLUS) w/Device KIT USE SEVERAL TIMES A WEEK UTD  . Cholecalciferol (VITAMIN D) 2000 units CAPS Take 2,000 Units by mouth daily.  Marland Kitchen dicyclomine (BENTYL) 20 MG tablet TK 1 T PO QID PRN  . omeprazole (PRILOSEC) 20 MG capsule Take 1 capsule by mouth daily.  . ondansetron (ZOFRAN-ODT) 4 MG disintegrating tablet Take 1 tablet (4 mg total) by mouth every 8 (eight) hours as needed for nausea or vomiting.  . pregabalin (LYRICA) 150 MG capsule Take 150 mg by mouth 2 (two) times daily.  . TRINTELLIX 10 MG TABS tablet Take 1 tablet by mouth daily.  . metoprolol succinate (TOPROL-XL) 25 MG 24 hr tablet Take 1 tablet (25 mg total) by mouth daily. Take with or immediately following a meal.   No current facility-administered medications on file prior to visit.     Allergies:   Oxycodone   Social History   Tobacco  Use  . Smoking status: Former Smoker    Packs/day: 1.00    Years: 33.00    Pack years: 33.00    Types: Cigarettes    Quit date: 11/10/1987    Years since quitting: 32.1  . Smokeless tobacco: Never Used  Substance Use Topics  . Alcohol use: No  . Drug use: No    Family History: The patient's family history includes Allergies in her sister.  ROS:   Please see the history of present illness.  Additional pertinent ROS negative except as noted.  EKGs/Labs/Other Studies Reviewed:    The following studies  were reviewed today: CT cardiac 07/26/19 FINDINGS: Coronary calcium score: The patient's coronary artery calcium score is 1257, which places the patient in the 95 percentile.  Coronary arteries: Normal coronary origins.  Right dominance.  Right Coronary Artery: There is calcified plaque in the proximal RCA with 1-24% stenosis, followed by another calcified plaque with 25-49% stenosis. The mid RCA has mixed plaque with 25-49% stenosis.  Left Main Coronary Artery: Calcified plaque, 1-24% stenosis  Left Anterior Descending Coronary Artery: Proximal portion with calcified plaque, 1-24% stenosis. Midportion with calcified plaque, 1-24% stenosis. Distal portion without significant stenosis.  Left Circumflex Artery: There is a mixed calcified and noncalcified plaque in the proximal LCx with 1-24% stenosis, followed by a calcified plaque with 25-49% stenosis. There is another calcified plaque in the mid to distal segment of the LCx with 25-49% stenosis.  Aorta: Normal size, 29 mm at the mid ascending aorta (level of the PA bifurcation) measured double oblique. Scattered calcifications, especially surrounding coronary ostia. No dissection.  Aortic Valve: No calcifications. Trileaflet.  Other findings:  Normal pulmonary vein drainage into the left atrium.  Normal left atrial appendage without a thrombus.  Normal size of the pulmonary artery (Upper limit of normal).  IMPRESSION: 1.  Mild nonobstructive CAD, CADRADS = 2.  2. Coronary calcium score of 1257. This was 95 percentile for age and sex matched control.  3. Normal coronary origin with right dominance.  Echo 06/13/19 1. The left ventricle has hyperdynamic systolic function, with an ejection fraction of >65%. The cavity size was normal. There is severe asymmetric left ventricular hypertrophy. Left ventricular diastolic Doppler parameters are consistent with impaired  relaxation.  2. The right ventricle has normal  systolic function. The cavity was normal. There is no increase in right ventricular wall thickness.  3. Trivial pericardial effusion is present.  4. No stenosis of the aortic valve.  5. The aorta is normal in size and structure.  6. The aortic root and ascending aorta are normal in size and structure.  7. Grossly normal.  8. The average left ventricular global longitudinal strain is -14.8 %.  EKG:  EKG is personally reviewed.  The ekg ordered 06/30/19 demonstrates NSR with nonspecific ST changes, HR 67 bpm  Recent Labs: 10/24/2019: ALT 10; BUN 18; Creatinine, Ser 0.69; Hemoglobin 13.9; Platelets 264; Potassium 3.2; Sodium 140  Recent Lipid Panel No results found for: CHOL, TRIG, HDL, CHOLHDL, VLDL, LDLCALC, LDLDIRECT  Physical Exam:    VS:  BP (!) 152/79   Pulse 65   Temp (!) 97.1 F (36.2 C)   Ht 5' 5"  (1.651 m)   Wt 144 lb (65.3 kg)   SpO2 100%   BMI 23.96 kg/m     Wt Readings from Last 3 Encounters:  01/01/20 144 lb (65.3 kg)  12/25/19 146 lb 12.8 oz (66.6 kg)  08/24/19 150 lb 12.8 oz (68.4 kg)  GEN: Well nourished, well developed in no acute distress HEENT: Normal, moist mucous membranes NECK: No JVD CARDIAC: regular rhythm, normal S1 and S2, no rubs or gallops. 1/6 SEM today. VASCULAR: Radial and DP pulses 2+ bilaterally. No carotid bruits RESPIRATORY:  Clear to auscultation without rales, wheezing or rhonchi  ABDOMEN: Soft, non-tender, non-distended MUSCULOSKELETAL:  Ambulates independently SKIN: Warm and dry, trivial nonpitting LLE edema NEUROLOGIC:  Alert and oriented x 3. No focal neuro deficits noted. PSYCHIATRIC:  Normal affect    ASSESSMENT:    1. LVH (left ventricular hypertrophy)   2. Aortic atherosclerosis ([place])   3. Mixed hyperlipidemia   4. Nonocclusive coronary atherosclerosis of native coronary artery   5. Other chest pain    PLAN:   Severe asymmetric LV hypertrophy, with septum 1.96 cm:  -no syncope -tolerating metoprolol succinate 25 mg  daily -avoid dehydration  Left chest/upper extremity/back pain -ct cardiac reviewed today. Has significant calcium but CAD is nonobstructive -sharp in nature, with moving/using arms, consider MSK  Aortic atherosclerosis, calcified nonobstructive CAD -continue aspirin 81 mg daily -continue statin  Mixed hyperlipidemia -continue atorvastatin 40 mg daily  Plan for follow up: 1 year or sooner PRN  Medication Adjustments/Labs and Tests Ordered: Current medicines are reviewed at length with the patient today.  Concerns regarding medicines are outlined above.  No orders of the defined types were placed in this encounter.  No orders of the defined types were placed in this encounter.   Patient Instructions  Medication Instructions:  Your Physician recommend you continue on your current medication as directed.    *If you need a refill on your cardiac medications before your next appointment, please call your pharmacy*  Lab Work: None  Testing/Procedures: None  Follow-Up: At [facility], you and your health needs are our priority.  As part of our continuing mission to provide you with exceptional heart care, we have created designated Provider Care Teams.  These Care Teams include your primary Cardiologist (physician) and Advanced Practice Providers (APPs -  Physician Assistants and Nurse Practitioners) who all work together to provide you with the care you need, when you need it.  Your next appointment:   1 year(s)  The format for your next appointment:   In Person  Provider:   Buford Dresser, MD       Signed, Buford Dresser, MD PhD 01/01/2020 5:22 PM    [employer]

## 2020-01-01 NOTE — Patient Instructions (Signed)

## 2020-01-09 ENCOUNTER — Other Ambulatory Visit: Payer: Self-pay | Admitting: Family Medicine

## 2020-01-09 DIAGNOSIS — Z1231 Encounter for screening mammogram for malignant neoplasm of breast: Secondary | ICD-10-CM

## 2020-02-08 ENCOUNTER — Ambulatory Visit
Admission: RE | Admit: 2020-02-08 | Discharge: 2020-02-08 | Disposition: A | Discharge status: Home / Self Care | Payer: Medicare Other | Source: Ambulatory Visit | Attending: Family Medicine | Admitting: Family Medicine

## 2020-02-08 ENCOUNTER — Other Ambulatory Visit: Payer: Self-pay

## 2020-02-08 DIAGNOSIS — Z1231 Encounter for screening mammogram for malignant neoplasm of breast: Secondary | ICD-10-CM | POA: Diagnosis not present

## 2020-04-24 DIAGNOSIS — E1169 Type 2 diabetes mellitus with other specified complication: Secondary | ICD-10-CM | POA: Diagnosis not present

## 2020-04-24 DIAGNOSIS — Z23 Encounter for immunization: Secondary | ICD-10-CM | POA: Diagnosis not present

## 2020-04-24 DIAGNOSIS — F419 Anxiety disorder, unspecified: Secondary | ICD-10-CM | POA: Diagnosis not present

## 2020-04-24 DIAGNOSIS — E785 Hyperlipidemia, unspecified: Secondary | ICD-10-CM | POA: Diagnosis not present

## 2020-04-24 DIAGNOSIS — Z1389 Encounter for screening for other disorder: Secondary | ICD-10-CM | POA: Diagnosis not present

## 2020-04-24 DIAGNOSIS — J449 Chronic obstructive pulmonary disease, unspecified: Secondary | ICD-10-CM | POA: Diagnosis not present

## 2020-04-24 DIAGNOSIS — G629 Polyneuropathy, unspecified: Secondary | ICD-10-CM | POA: Diagnosis not present

## 2020-04-24 DIAGNOSIS — I10 Essential (primary) hypertension: Secondary | ICD-10-CM | POA: Diagnosis not present

## 2020-04-24 DIAGNOSIS — M8588 Other specified disorders of bone density and structure, other site: Secondary | ICD-10-CM | POA: Diagnosis not present

## 2020-04-24 DIAGNOSIS — Z0000 Encounter for general adult medical examination without abnormal findings: Secondary | ICD-10-CM | POA: Diagnosis not present

## 2020-04-24 DIAGNOSIS — I700 Atherosclerosis of aorta: Secondary | ICD-10-CM | POA: Diagnosis not present

## 2020-04-24 NOTE — Telephone Encounter (Signed)
Can we get her in to see an APP in the next few weeks? Thanks.

## 2020-04-29 ENCOUNTER — Telehealth: Payer: Self-pay | Admitting: Pulmonary Disease

## 2020-04-29 MED ORDER — ALBUTEROL SULFATE HFA 108 (90 BASE) MCG/ACT IN AERS: 2.0000 | INHALATION_SPRAY | Freq: Four times a day (QID) | RESPIRATORY_TRACT | 3 refills | Status: AC | PRN

## 2020-04-29 NOTE — Telephone Encounter (Signed)
Rx for proventil hfa has been sent to pharmacy for pt. Attempted to call pt but unable to reach. Left pt a detailed message letting her know that this was taken care of. Nothing further needed.

## 2020-05-02 ENCOUNTER — Ambulatory Visit (INDEPENDENT_AMBULATORY_CARE_PROVIDER_SITE_OTHER): Payer: Medicare Other

## 2020-05-02 ENCOUNTER — Encounter (INDEPENDENT_AMBULATORY_CARE_PROVIDER_SITE_OTHER): Payer: Self-pay | Admitting: Nurse Practitioner

## 2020-05-02 ENCOUNTER — Ambulatory Visit (INDEPENDENT_AMBULATORY_CARE_PROVIDER_SITE_OTHER): Payer: Medicare Other | Admitting: Nurse Practitioner

## 2020-05-02 ENCOUNTER — Other Ambulatory Visit (INDEPENDENT_AMBULATORY_CARE_PROVIDER_SITE_OTHER): Payer: Self-pay | Admitting: Nurse Practitioner

## 2020-05-02 ENCOUNTER — Other Ambulatory Visit: Payer: Self-pay

## 2020-05-02 VITALS — BP 145/55 | HR 66 | Resp 16 | Wt 141.0 lb

## 2020-05-02 DIAGNOSIS — M179 Osteoarthritis of knee, unspecified: Secondary | ICD-10-CM | POA: Insufficient documentation

## 2020-05-02 DIAGNOSIS — E7800 Pure hypercholesterolemia, unspecified: Secondary | ICD-10-CM

## 2020-05-02 DIAGNOSIS — R7303 Prediabetes: Secondary | ICD-10-CM | POA: Insufficient documentation

## 2020-05-02 DIAGNOSIS — I739 Peripheral vascular disease, unspecified: Secondary | ICD-10-CM

## 2020-05-02 DIAGNOSIS — F411 Generalized anxiety disorder: Secondary | ICD-10-CM | POA: Insufficient documentation

## 2020-05-02 DIAGNOSIS — I10 Essential (primary) hypertension: Secondary | ICD-10-CM | POA: Diagnosis not present

## 2020-05-02 DIAGNOSIS — M25579 Pain in unspecified ankle and joints of unspecified foot: Secondary | ICD-10-CM | POA: Insufficient documentation

## 2020-05-02 DIAGNOSIS — M1991 Primary osteoarthritis, unspecified site: Secondary | ICD-10-CM | POA: Insufficient documentation

## 2020-05-02 DIAGNOSIS — M1710 Unilateral primary osteoarthritis, unspecified knee: Secondary | ICD-10-CM | POA: Insufficient documentation

## 2020-05-06 ENCOUNTER — Encounter (INDEPENDENT_AMBULATORY_CARE_PROVIDER_SITE_OTHER): Payer: Self-pay | Admitting: Nurse Practitioner

## 2020-05-06 NOTE — Progress Notes (Signed)
Subjective:    Patient ID: Deborah Waller, female    DOB: 1941-07-03, 79 y.o.   MRN: [id number] Chief Complaint  Patient presents with  . Follow-up    ultrasound follow up    The patient returns to the office for followup and review of the noninvasive studies. There have been no interval changes in lower extremity symptoms. No interval shortening of the patient's claudication distance or development of rest pain symptoms. No new ulcers or wounds have occurred since the last visit.  The patient has a previous history of bilateral femoropopliteal bypass graft and she denies any significant issues since her last office visit.  There have been no significant changes to the patient's overall health care.  The patient denies amaurosis fugax or recent TIA symptoms. There are no recent neurological changes noted. The patient denies history of DVT, PE or superficial thrombophlebitis. The patient denies recent episodes of angina or shortness of breath.   ABI Rt=1.12 and Lt=1.06  (previous ABI's Rt=1.12 and Lt=1.03) Duplex ultrasound of the tibial arteries on the right lower extremity revealed triphasic waveforms with biphasic/triphasic waveforms in the left tibial arteries.  The bilateral lower extremity arterial duplex study shows the bilateral femoropopliteal bypass grafts as being widely patent with no evidence of restenosis.  Biphasic flow throughout both bypass grafts.   Review of Systems  All other systems reviewed and are negative.      Objective:   Physical Exam Vitals reviewed.  HENT:     Head: Normocephalic.  Cardiovascular:     Rate and Rhythm: Normal rate and regular rhythm.     Pulses: Normal pulses.  Pulmonary:     Effort: Pulmonary effort is normal.  Musculoskeletal:        General: Normal range of motion.  Neurological:     Mental Status: She is alert and oriented to person, place, and time.  Psychiatric:        Mood and Affect: Mood normal.        Behavior:  Behavior normal.        Thought Content: Thought content normal.        Judgment: Judgment normal.     BP (!) 145/55 (BP Location: Right Arm)   Pulse 66   Resp 16   Wt 141 lb (64 kg)   BMI 23.46 kg/m   Past Medical History:  Diagnosis Date  . Anxiety   . Neuropathy of both feet     Social History   Socioeconomic History  . Marital status: Married    Spouse name: Not on file  . Number of children: Not on file  . Years of education: Not on file  . Highest education level: Not on file  Occupational History  . Not on file  Tobacco Use  . Smoking status: Former Smoker    Packs/day: 1.00    Years: 33.00    Pack years: 33.00    Types: Cigarettes    Quit date: 11/10/1987    Years since quitting: 32.5  . Smokeless tobacco: Never Used  Substance and Sexual Activity  . Alcohol use: No  . Drug use: No  . Sexual activity: Not on file  Other Topics Concern  . Not on file  Social History Narrative   Married, lives with husband, has one child, and is retired.    Social Determinants of Health   Financial Resource Strain:   . Difficulty of Paying Living Expenses:   Food Insecurity:   . Worried About  Running Out of Food in the Last Year:   . [place] in the Last Year:   Transportation Needs:   . Lack of Transportation (Medical):   Marland Kitchen Lack of Transportation (Non-Medical):   Physical Activity:   . Days of Exercise per Week:   . Minutes of Exercise per Session:   Stress:   . Feeling of Stress :   Social Connections:   . Frequency of Communication with Friends and Family:   . Frequency of Social Gatherings with Friends and Family:   . Attends Religious Services:   . Active Member of Clubs or Organizations:   . Attends Archivist Meetings:   Marland Kitchen Marital Status:   Intimate Partner Violence:   . Fear of Current or Ex-Partner:   . Emotionally Abused:   Marland Kitchen Physically Abused:   . Sexually Abused:     Past Surgical History:  Procedure Laterality Date  .  ABDOMINAL HYSTERECTOMY  1972  . ANKLE RECONSTRUCTION Left 12/2007  . BREAST EXCISIONAL BIOPSY Right   . BREAST LUMPECTOMY Right    benign- 2 lumps removed   . BUNIONECTOMY Left 2002, 2003  . CATARACT EXTRACTION Left 11/2001  . CATARACT EXTRACTION Right 10/2001  . GALLBLADDER SURGERY  2000  . leg bypass- left  03/04/2011  . leg bypass-right   04/22/2011  . LIPOMA EXCISION  07/18/2014  . OOPHORECTOMY  1995  . REPLACEMENT TOTAL KNEE Right 12/08/2011  . TOE SURGERY Left 2010  . TOTAL HIP ARTHROPLASTY Right 11/13/2008  . TOTAL HIP ARTHROPLASTY Left 06/2005    Family History  Problem Relation Age of Onset  . Allergies Sister   . Breast cancer Maternal Aunt     Allergies  Allergen Reactions  . Oxycodone     Childhood reaction, does not remember the reaction.        Assessment & Plan:   1. PVD (peripheral vascular disease) ([place])  Recommend:  The patient has evidence of atherosclerosis of the lower extremities .  The patient does not voice lifestyle limiting changes at this point in time.  Noninvasive studies do not suggest clinically significant change.  No invasive studies, angiography or surgery at this time The patient should continue walking and begin a more formal exercise program.  The patient should continue antiplatelet therapy and aggressive treatment of the lipid abnormalities  No changes in the patient's medications at this time  The patient should continue wearing graduated compression socks 10-15 mmHg strength to control the mild edema.   Patient will return in 1 year with noninvasive studies.  2. Essential hypertension Continue antihypertensive medications as already ordered, these medications have been reviewed and there are no changes at this time.   3. Pure hypercholesterolemia Continue statin as ordered and reviewed, no changes at this time    Current Outpatient Medications on File Prior to Visit  Medication Sig Dispense Refill  . albuterol  (VENTOLIN HFA) 108 (90 Base) MCG/ACT inhaler Inhale 2 puffs into the lungs every 6 (six) hours as needed for wheezing or shortness of breath. 18 g 3  . aspirin EC 81 MG tablet Take 1 tablet (81 mg total) by mouth daily. 90 tablet 3  . atorvastatin (LIPITOR) 40 MG tablet Take 1 tablet (40 mg total) by mouth daily. 90 tablet 3  . Blood Glucose Monitoring Suppl (ACCU-CHEK AVIVA PLUS) w/Device KIT USE SEVERAL TIMES A WEEK UTD  1  . Cholecalciferol (VITAMIN D) 2000 units CAPS Take 2,000 Units by mouth daily.    Marland Kitchen  dicyclomine (BENTYL) 20 MG tablet TK 1 T PO QID PRN  3  . lisinopril (ZESTRIL) 20 MG tablet Take 20 mg by mouth daily.    . metoprolol succinate (TOPROL-XL) 25 MG 24 hr tablet Take 1 tablet (25 mg total) by mouth daily. Take with or immediately following a meal. 30 tablet 11  . omeprazole (PRILOSEC) 20 MG capsule Take 1 capsule by mouth daily.    . ondansetron (ZOFRAN-ODT) 4 MG disintegrating tablet Take 1 tablet (4 mg total) by mouth every 8 (eight) hours as needed for nausea or vomiting. 20 tablet 0  . pregabalin (LYRICA) 150 MG capsule Take 150 mg by mouth 2 (two) times daily.    . TRINTELLIX 10 MG TABS tablet Take 1 tablet by mouth daily.     No current facility-administered medications on file prior to visit.    There are no Patient Instructions on file for this visit. No follow-ups on file.   Kris Hartmann, NP

## 2020-05-17 ENCOUNTER — Telehealth (INDEPENDENT_AMBULATORY_CARE_PROVIDER_SITE_OTHER): Payer: Medicare Other | Admitting: Cardiology

## 2020-05-17 VITALS — BP 155/61 | Ht 65.0 in | Wt 138.0 lb

## 2020-05-17 DIAGNOSIS — I2510 Atherosclerotic heart disease of native coronary artery without angina pectoris: Secondary | ICD-10-CM | POA: Diagnosis not present

## 2020-05-17 DIAGNOSIS — I10 Essential (primary) hypertension: Secondary | ICD-10-CM | POA: Diagnosis not present

## 2020-05-17 DIAGNOSIS — I700 Atherosclerosis of aorta: Secondary | ICD-10-CM | POA: Diagnosis not present

## 2020-05-17 DIAGNOSIS — E782 Mixed hyperlipidemia: Secondary | ICD-10-CM | POA: Diagnosis not present

## 2020-05-17 DIAGNOSIS — I517 Cardiomegaly: Secondary | ICD-10-CM | POA: Diagnosis not present

## 2020-05-17 MED ORDER — AMLODIPINE BESYLATE 5 MG PO TABS: 5.0000 mg | ORAL_TABLET | Freq: Every day | ORAL | 11 refills | Status: DC

## 2020-05-17 NOTE — Patient Instructions (Signed)
Medication Instructions:  Start Amlodipine 5 mg daily  *If you need a refill on your cardiac medications before your next appointment, please call your pharmacy*   Lab Work: None   Testing/Procedures: None   Follow-Up: At [facility], you and your health needs are our priority.  As part of our continuing mission to provide you with exceptional heart care, we have created designated Provider Care Teams.  These Care Teams include your primary Cardiologist (physician) and Advanced Practice Providers (APPs -  Physician Assistants and Nurse Practitioners) who all work together to provide you with the care you need, when you need it.  We recommend signing up for the patient portal called "MyChart".  Sign up information is provided on this After Visit Summary.  MyChart is used to connect with patients for Virtual Visits (Telemedicine).  Patients are able to view lab/test results, encounter notes, upcoming appointments, etc.  Non-urgent messages can be sent to your provider as well.   To learn more about what you can do with MyChart, go to [email or web address].    Your next appointment:   3-4 weeks  The format for your next appointment:   Virtual Visit   Provider:   Buford Dresser, MD

## 2020-05-17 NOTE — Progress Notes (Signed)
Virtual Visit via Video Note   This visit type was conducted due to national recommendations for restrictions regarding the COVID-19 Pandemic (e.g. social distancing) in an effort to limit this patient's exposure and mitigate transmission in our community.  Due to her co-morbid illnesses, this patient is at least at moderate risk for complications without adequate follow up.  This format is felt to be most appropriate for this patient at this time.  All issues noted in this document were discussed and addressed.  A limited physical exam was performed with this format.  Please refer to the patient's chart for her consent to telehealth for [facility].      The patient was identified using 2 identifiers.  Patient Location: Home Provider Location: Home Office  Date:  05/17/2020   ID:  Deborah Waller [facility], DOB Jun 10, 1941, MRN [id number]  PCP:  Donald Prose, MD  Cardiologist:  Buford Dresser, MD PhD  Referring MD: Donald Prose, MD   CC: follow up  History of Present Illness:    Deborah Waller (Pronouced ko-TONE) is a 79 y.o. female with a hx of PMH mild COPD, pulmondary nodules (followed by Dr. Vaughan Browner), PAD with prior bilateral bypass surgery followed by Dr. Lucky Cowboy, hyperlipidemia, reported hypertension but on no meds, former tobacco abuse, claustrophobia/anxiety, neuropathy. (Denies history of type II diabetes, last A1c 5.9) who is seen for follow up today. Initial consultation (virtual) with me on 04/27/19.  Today: Had started lisinopril, was on it for about a week and had to stop. Bothered her GI system severely, flared her IBS. Has felt better since stopping.  BP log range has been 143-170/70s.  Denies shortness of breath at rest or with normal exertion. No PND, orthopnea, LE edema or unexpected weight gain. No syncope or palpitations. Chest pain intermittent and sharp, unchanged.  Past Medical History:  Diagnosis Date  . Anxiety   . Neuropathy of both feet     Past  Surgical History:  Procedure Laterality Date  . ABDOMINAL HYSTERECTOMY  1972  . ANKLE RECONSTRUCTION Left 12/2007  . BREAST EXCISIONAL BIOPSY Right   . BREAST LUMPECTOMY Right    benign- 2 lumps removed   . BUNIONECTOMY Left 2002, 2003  . CATARACT EXTRACTION Left 11/2001  . CATARACT EXTRACTION Right 10/2001  . GALLBLADDER SURGERY  2000  . leg bypass- left  03/04/2011  . leg bypass-right   04/22/2011  . LIPOMA EXCISION  07/18/2014  . OOPHORECTOMY  1995  . REPLACEMENT TOTAL KNEE Right 12/08/2011  . TOE SURGERY Left 2010  . TOTAL HIP ARTHROPLASTY Right 11/13/2008  . TOTAL HIP ARTHROPLASTY Left 06/2005    Current Medications: Current Outpatient Medications on File Prior to Visit  Medication Sig  . albuterol (VENTOLIN HFA) 108 (90 Base) MCG/ACT inhaler Inhale 2 puffs into the lungs every 6 (six) hours as needed for wheezing or shortness of breath.  Marland Kitchen aspirin EC 81 MG tablet Take 1 tablet (81 mg total) by mouth daily.  Marland Kitchen atorvastatin (LIPITOR) 40 MG tablet Take 1 tablet (40 mg total) by mouth daily.  . Blood Glucose Monitoring Suppl (ACCU-CHEK AVIVA PLUS) w/Device KIT USE SEVERAL TIMES A WEEK UTD  . Cholecalciferol (VITAMIN D) 2000 units CAPS Take 2,000 Units by mouth daily.  Marland Kitchen dicyclomine (BENTYL) 20 MG tablet TK 1 T PO QID PRN  . metoprolol succinate (TOPROL-XL) 25 MG 24 hr tablet Take 1 tablet (25 mg total) by mouth daily. Take with or immediately following a meal.  . omeprazole (PRILOSEC) 20  MG capsule Take 1 capsule by mouth daily.  . ondansetron (ZOFRAN-ODT) 4 MG disintegrating tablet Take 1 tablet (4 mg total) by mouth every 8 (eight) hours as needed for nausea or vomiting.  . pregabalin (LYRICA) 150 MG capsule Take 150 mg by mouth daily.   . TRINTELLIX 20 MG TABS tablet Take 1 tablet by mouth daily.    No current facility-administered medications on file prior to visit.     Allergies:   Oxycodone   Social History   Tobacco Use  . Smoking status: Former Smoker     Packs/day: 1.00    Years: 33.00    Pack years: 33.00    Types: Cigarettes    Quit date: 11/10/1987    Years since quitting: 32.5  . Smokeless tobacco: Never Used  Substance Use Topics  . Alcohol use: No  . Drug use: No    Family History: The patient's family history includes Allergies in her sister; Breast cancer in her maternal aunt.  ROS:   Please see the history of present illness.  Additional pertinent ROS negative except as noted.  EKGs/Labs/Other Studies Reviewed:    The following studies were reviewed today: CT cardiac 07/26/19 FINDINGS: Coronary calcium score: The patient's coronary artery calcium score is 1257, which places the patient in the 95 percentile.  Coronary arteries: Normal coronary origins.  Right dominance.  Right Coronary Artery: There is calcified plaque in the proximal RCA with 1-24% stenosis, followed by another calcified plaque with 25-49% stenosis. The mid RCA has mixed plaque with 25-49% stenosis.  Left Main Coronary Artery: Calcified plaque, 1-24% stenosis  Left Anterior Descending Coronary Artery: Proximal portion with calcified plaque, 1-24% stenosis. Midportion with calcified plaque, 1-24% stenosis. Distal portion without significant stenosis.  Left Circumflex Artery: There is a mixed calcified and noncalcified plaque in the proximal LCx with 1-24% stenosis, followed by a calcified plaque with 25-49% stenosis. There is another calcified plaque in the mid to distal segment of the LCx with 25-49% stenosis.  Aorta: Normal size, 29 mm at the mid ascending aorta (level of the PA bifurcation) measured double oblique. Scattered calcifications, especially surrounding coronary ostia. No dissection.  Aortic Valve: No calcifications. Trileaflet.  Other findings:  Normal pulmonary vein drainage into the left atrium.  Normal left atrial appendage without a thrombus.  Normal size of the pulmonary artery (Upper limit of  normal).  IMPRESSION: 1.  Mild nonobstructive CAD, CADRADS = 2.  2. Coronary calcium score of 1257. This was 95 percentile for age and sex matched control.  3. Normal coronary origin with right dominance.  Echo 06/13/19 1. The left ventricle has hyperdynamic systolic function, with an ejection fraction of >65%. The cavity size was normal. There is severe asymmetric left ventricular hypertrophy. Left ventricular diastolic Doppler parameters are consistent with impaired  relaxation.  2. The right ventricle has normal systolic function. The cavity was normal. There is no increase in right ventricular wall thickness.  3. Trivial pericardial effusion is present.  4. No stenosis of the aortic valve.  5. The aorta is normal in size and structure.  6. The aortic root and ascending aorta are normal in size and structure.  7. Grossly normal.  8. The average left ventricular global longitudinal strain is -14.8 %.  EKG:  EKG is personally reviewed.  The ekg ordered 06/30/19 demonstrates NSR with nonspecific ST changes, HR 67 bpm  Recent Labs: 10/24/2019: ALT 10; BUN 18; Creatinine, Ser 0.69; Hemoglobin 13.9; Platelets 264; Potassium 3.2; Sodium 140  Recent Lipid Panel No results found for: CHOL, TRIG, HDL, CHOLHDL, VLDL, LDLCALC, LDLDIRECT  Physical Exam:    VS:  BP (!) 155/61   Ht _0  (1.651 m)   Wt 138 lb (62.6 kg)   BMI 22.96 kg/m     Wt Readings from Last 3 Encounters:  05/17/20 138 lb (62.6 kg)  05/02/20 141 lb (64 kg)  01/01/20 144 lb (65.3 kg)    VITAL SIGNS:  reviewed GEN:  no acute distress EYES:  sclerae anicteric, EOMI - Extraocular Movements Intact RESPIRATORY:  normal respiratory effort, symmetric expansion CARDIOVASCULAR:  no visible JVD SKIN:  no rash, lesions or ulcers. MUSCULOSKELETAL:  no obvious deformities. NEURO:  alert and oriented x 3, no obvious focal deficit PSYCH:  normal affect  ASSESSMENT:    1. Essential hypertension   2. LVH (left ventricular  hypertrophy)   3. Aortic atherosclerosis ([place])   4. Nonocclusive coronary atherosclerosis of native coronary artery   5. Mixed hyperlipidemia    PLAN:   Severe asymmetric LV hypertrophy, with septum 1.96 cm:  -no syncope -tolerating metoprolol succinate 25 mg daily -avoid dehydration  Hypertension:  -did not tolerate lisinopril due to GI issues/IBS -Bps remain elevated -start amlodipine today -follow up in 3-4 weeks for BP check -continue metoprolol  Left chest/upper extremity/back pain: intermittent but improved -ct cardiac noted significant calcium but CAD is nonobstructive -sharp in nature, with moving/using arms, likely MSK  Aortic atherosclerosis, calcified nonobstructive CAD -continue aspirin 81 mg daily -continue statin  Mixed hyperlipidemia -continue atorvastatin 40 mg daily  Plan for follow up: 3-4 weeks to monitor response to medication changes  Today, I have spent 12 minutes with the patient with telehealth technology discussing the above problems.  Additional time spent in chart review, documentation, and communication.  Medication Adjustments/Labs and Tests Ordered: Current medicines are reviewed at length with the patient today.  Concerns regarding medicines are outlined above.  No orders of the defined types were placed in this encounter.  Meds ordered this encounter  Medications  . amLODipine (NORVASC) 5 MG tablet    Sig: Take 1 tablet (5 mg total) by mouth daily.    Dispense:  30 tablet    Refill:  11    Patient Instructions  Medication Instructions:  Start Amlodipine 5 mg daily  *If you need a refill on your cardiac medications before your next appointment, please call your pharmacy*   Lab Work: None   Testing/Procedures: None   Follow-Up: At [facility], you and your health needs are our priority.  As part of our continuing mission to provide you with exceptional heart care, we have created designated Provider Care Teams.  These Care  Teams include your primary Cardiologist (physician) and Advanced Practice Providers (APPs -  Physician Assistants and Nurse Practitioners) who all work together to provide you with the care you need, when you need it.  We recommend signing up for the patient portal called "MyChart".  Sign up information is provided on this After Visit Summary.  MyChart is used to connect with patients for Virtual Visits (Telemedicine).  Patients are able to view lab/test results, encounter notes, upcoming appointments, etc.  Non-urgent messages can be sent to your provider as well.   To learn more about what you can do with MyChart, go to [email or web address].    Your next appointment:   3-4 weeks  The format for your next appointment:   Virtual Visit   Provider:   Buford Dresser, MD  Signed, Buford Dresser, MD PhD 05/17/2020    [facility]

## 2020-05-29 ENCOUNTER — Telehealth: Payer: Medicare Other | Admitting: Cardiology

## 2020-06-14 ENCOUNTER — Other Ambulatory Visit: Payer: Self-pay | Admitting: Cardiology

## 2020-06-18 ENCOUNTER — Telehealth: Payer: Medicare Other | Admitting: Cardiology

## 2020-06-24 ENCOUNTER — Ambulatory Visit: Payer: Medicare Other | Admitting: Pulmonary Disease

## 2020-06-24 DIAGNOSIS — M546 Pain in thoracic spine: Secondary | ICD-10-CM | POA: Diagnosis not present

## 2020-06-24 DIAGNOSIS — M542 Cervicalgia: Secondary | ICD-10-CM | POA: Diagnosis not present

## 2020-06-26 ENCOUNTER — Other Ambulatory Visit: Payer: Self-pay

## 2020-06-26 ENCOUNTER — Encounter: Payer: Self-pay | Admitting: Pulmonary Disease

## 2020-06-26 ENCOUNTER — Ambulatory Visit (INDEPENDENT_AMBULATORY_CARE_PROVIDER_SITE_OTHER): Payer: Medicare Other | Admitting: Pulmonary Disease

## 2020-06-26 VITALS — BP 122/62 | HR 60 | Temp 97.2°F | Ht 65.0 in | Wt 140.4 lb

## 2020-06-26 DIAGNOSIS — J449 Chronic obstructive pulmonary disease, unspecified: Secondary | ICD-10-CM

## 2020-06-26 NOTE — Progress Notes (Signed)
@Patient  ID: Deborah Waller, female    DOB: 13-Feb-1941, 79 y.o.   MRN: [id number]  Chief Complaint  Patient presents with  . Follow-up    sob-same    Referring provider: Donald Prose, MD  HPI:  79 year old female former smoker followed in our office for COPD/emphysema.  Patient has been tried on maintenance inhalers in the past and has found no improvement.  Patient reports symptomatic improvement when using rescue inhaler.  PMH: Hyperlipidemia, anxiety, claustrophobia Smoker/ Smoking History: Former Smoker. Quit 1989. 33 pack years.  Maintenance:  None, tried Breo and Symbicort in past with no improvement Pt of: Dr. Vaughan Browner  06/26/2020  - Visit   79 year old female former smoker followed in our office for COPD.  She has been tried on maintenance inhalers in the past but did not find any improvement.  She is presenting today as a 61-monthfollow-up.  She has been doing well.  She is only had to use her rescue inhaler about 2 times over the last 3 months.  She is currently dealing with back problems and is about to start physical therapy.  She feels that her breathing is stable.  She admits that she is not overly active and is hoping that physical therapy will help with increasing her overall physical activity.  Questionaires / Pulmonary Flowsheets:   ACT:  No flowsheet data found.  MMRC: mMRC Dyspnea Scale mMRC Score  12/25/2019 1  08/24/2019 1    [place]:  No flowsheet data found.  Tests:    Imaging Data from previous pulmonologist reviewed including images (except CT scan from 2015) CT scan  07/30/14-. Small lung nodules CT scan 02/07/15- Small pulmonary nodule stable. CT scan 07/18/15-3 to 4 mm pulmonary nodule in the right upper lobe. 2-3 mm calcified nodule in the right lower lobe. 2-3 mm calcified granuloma in the left lower lobe. Subsegmental atelectasis in the right lower lobe. Diffuse emphysematous changes.  [facility] mm  right middle lobe nodule, 3 mm right lower lobe nodule, 2 mm right lower lobe nodule, 1-2 mm right lower lobe nodule, 1-2 mm left lower lobe nodule.   CT abdomen pelvis  12/25/17-stable lower lobe calcified nodules.  Lung images are otherwise normal  05/22/2019-CT chest without contrast-mild/moderate centrilobular emphysema again seen, stable mild right lower lobe scarring, tiny calcified granulomas are again seen in both lungs, no suspicious pulmonary nodules or masses are identified, no evidence of pulmonary infiltrate or pleural effusion  PFTs PFT 2016 -mild airflow obstruction, moderate diffusion abnormality as per pulmonologist note. Actual study not sent.  PFTs 02/22/17 FVC 2.62 [89%), FEV1 1.94 (88%), F/F 74, TLC 70%, DLCO 54% Mild obstruction with bronchodilator response, mild restriction with moderate diffusion defect.    FENO:  No results found for: NITRICOXIDE  PFT: PFT Results Latest Ref Rng & Units 02/22/2017  FVC-Pre L 2.14  FVC-Predicted Pre % 73  FVC-Post L 2.62  FVC-Predicted Post % 89  Pre FEV1/FVC % % 77  Post FEV1/FCV % % 74  FEV1-Pre L 1.66  FEV1-Predicted Pre % 75  FEV1-Post L 1.94  DLCO uncorrected ml/min/mmHg 14.08  DLCO UNC% % 54  DLCO corrected ml/min/mmHg 13.79  DLCO COR %Predicted % 53  DLVA Predicted % 69  TLC L 3.66  TLC % Predicted % 70  RV % Predicted % 47    WALK:  SIX MIN WALK 08/24/2019  Supplimental Oxygen during Test? (L/min) No  Tech Comments: Patient was able to complete  1 lap. Was able to hold a conversation without stopping. Patient was a bit unstead during walk due to her neuropathy but denied any SOB or chest pain. O2 was not needed during or after walk.    Imaging: No results found.  Lab Results:  CBC    Component Value Date/Time   WBC 12.0 (H) 10/24/2019 1750   RBC 4.81 10/24/2019 1750   HGB 13.9 10/24/2019 1750   HCT 42.1 10/24/2019 1750   PLT 264 10/24/2019 1750   MCV 87.5 10/24/2019 1750   MCH 28.9 10/24/2019 1750    MCHC 33.0 10/24/2019 1750   RDW 12.8 10/24/2019 1750   LYMPHSABS 3.9 03/24/2019 1500   MONOABS 0.8 03/24/2019 1500   EOSABS 0.2 03/24/2019 1500   BASOSABS 0.0 03/24/2019 1500    BMET    Component Value Date/Time   NA 140 10/24/2019 1750   NA 142 07/19/2019 1142   K 3.2 (L) 10/24/2019 1750   CL 102 10/24/2019 1750   CO2 27 10/24/2019 1750   GLUCOSE 134 (H) 10/24/2019 1750   BUN 18 10/24/2019 1750   BUN 17 07/19/2019 1142   CREATININE 0.69 10/24/2019 1750   CREATININE 0.89 03/24/2019 1526   CALCIUM 9.0 10/24/2019 1750   GFRNONAA >60 10/24/2019 1750   GFRAA >60 10/24/2019 1750    BNP No results found for: BNP  ProBNP No results found for: PROBNP  Specialty Problems      Pulmonary Problems   COPD (chronic obstructive pulmonary disease) (HCC)    PFTs 02/22/17 FVC 2.62 [89%), FEV1 1.94 (88%), F/F 74, TLC 70%, DLCO 54% Mild obstruction with bronchodilator response, mild restriction with moderate diffusion defect.  05/22/2019-CT chest without contrast-mild/moderate centrilobular emphysema again seen, stable mild right lower lobe scarring, tiny calcified granulomas are again seen in both lungs, no suspicious pulmonary nodules or masses are identified, no evidence of pulmonary infiltrate or pleural effusion       Shortness of breath      Allergies  Allergen Reactions  . Oxycodone     Childhood reaction, does not remember the reaction.     Immunization History  Administered Date(s) Administered  . Influenza, High Dose Seasonal PF 08/23/2018, 08/05/2019  . Influenza-Unspecified 09/10/2015, 08/08/2017  . PFIZER SARS-COV-2 Vaccination 11/19/2019, 12/10/2019  . Pneumococcal Conjugate-13 11/09/2014  . Pneumococcal Polysaccharide-23 11/09/2012  . Zoster 11/09/2012    Past Medical History:  Diagnosis Date  . Anxiety   . Neuropathy of both feet     Tobacco History: Social History   Tobacco Use  Smoking Status Former Smoker  . Packs/day: 1.00  . Years: 33.00   . Pack years: 33.00  . Types: Cigarettes  . Quit date: 11/10/1987  . Years since quitting: 32.6  Smokeless Tobacco Never Used   Counseling given: Not Answered   Continue to not smoke  Outpatient Encounter Medications as of 06/26/2020  Medication Sig  . albuterol (VENTOLIN HFA) 108 (90 Base) MCG/ACT inhaler Inhale 2 puffs into the lungs every 6 (six) hours as needed for wheezing or shortness of breath.  Marland Kitchen amLODipine (NORVASC) 5 MG tablet Take 1 tablet (5 mg total) by mouth daily.  Marland Kitchen aspirin EC 81 MG tablet Take 1 tablet (81 mg total) by mouth daily.  Marland Kitchen atorvastatin (LIPITOR) 40 MG tablet Take 1 tablet (40 mg total) by mouth daily.  . Blood Glucose Monitoring Suppl (ACCU-CHEK AVIVA PLUS) w/Device KIT USE SEVERAL TIMES A WEEK UTD  . Cholecalciferol (VITAMIN D) 2000 units CAPS Take 2,000 Units by  mouth daily.  Marland Kitchen dicyclomine (BENTYL) 20 MG tablet TK 1 T PO QID PRN  . metoprolol succinate (TOPROL-XL) 25 MG 24 hr tablet TAKE 1 TABLET BY MOUTH DAILY WITH OR IMMEDIATELY FOLLOWING A MEAL  . omeprazole (PRILOSEC) 20 MG capsule Take 1 capsule by mouth daily.  . ondansetron (ZOFRAN-ODT) 4 MG disintegrating tablet Take 1 tablet (4 mg total) by mouth every 8 (eight) hours as needed for nausea or vomiting.  . pregabalin (LYRICA) 150 MG capsule Take 150 mg by mouth daily.   . TRINTELLIX 20 MG TABS tablet Take 1 tablet by mouth daily.    No facility-administered encounter medications on file as of 06/26/2020.     Review of Systems  Review of Systems  Constitutional: Positive for fatigue. Negative for activity change and fever.  HENT: Negative for sinus pressure, sinus pain and sore throat.   Respiratory: Negative for cough, shortness of breath and wheezing.   Cardiovascular: Negative for chest pain and palpitations.  Gastrointestinal: Negative for diarrhea, nausea and vomiting.  Musculoskeletal: Positive for back pain, neck pain and neck stiffness. Negative for arthralgias.  Neurological: Negative  for dizziness.  Psychiatric/Behavioral: Negative for sleep disturbance. The patient is not nervous/anxious.      Physical Exam  BP 122/62 (BP Location: Left Arm, Cuff Size: Normal)   Pulse 60   Temp (!) 97.2 F (36.2 C) (Oral)   Ht 5' 5"  (1.651 m)   Wt 140 lb 6.4 oz (63.7 kg)   SpO2 99%   BMI 23.36 kg/m   Wt Readings from Last 5 Encounters:  06/26/20 140 lb 6.4 oz (63.7 kg)  05/17/20 138 lb (62.6 kg)  05/02/20 141 lb (64 kg)  01/01/20 144 lb (65.3 kg)  12/25/19 146 lb 12.8 oz (66.6 kg)    BMI Readings from Last 5 Encounters:  06/26/20 23.36 kg/m  05/17/20 22.96 kg/m  05/02/20 23.46 kg/m  01/01/20 23.96 kg/m  12/25/19 24.43 kg/m     Physical Exam Vitals and nursing note reviewed.  Constitutional:      General: She is not in acute distress.    Appearance: Normal appearance.  HENT:     Head: Normocephalic and atraumatic.     Right Ear: External ear normal.     Left Ear: External ear normal.     Nose: Nose normal. No congestion.     Mouth/Throat:     Mouth: Mucous membranes are moist.     Pharynx: Oropharynx is clear.  Eyes:     Pupils: Pupils are equal, round, and reactive to light.  Cardiovascular:     Rate and Rhythm: Normal rate and regular rhythm.     Pulses: Normal pulses.     Heart sounds: Normal heart sounds. No murmur heard.   Pulmonary:     Breath sounds: Normal breath sounds. No decreased air movement. No decreased breath sounds, wheezing or rales.  Skin:    General: Skin is warm and dry.     Capillary Refill: Capillary refill takes less than 2 seconds.  Neurological:     General: No focal deficit present.     Mental Status: She is alert and oriented to person, place, and time. Mental status is at baseline.     Gait: Gait normal.  Psychiatric:        Mood and Affect: Mood normal.        Behavior: Behavior normal.        Thought Content: Thought content normal.  Judgment: Judgment normal.       Assessment & Plan:   COPD  (chronic obstructive pulmonary disease) ([place]) Plan: Continue rescue inhaler We will have patient follow-up with our office in 1 year or sooner if patient symptoms worsen Work on increasing overall physical activity Work with physical therapy Present to our office sooner if you have acute changes with respiratory status    Return in about 1 year (around 06/26/2021), or if symptoms worsen or fail to improve, for Follow up with Dr. Vaughan Browner.   Lauraine Rinne, NP 06/26/2020   This appointment required 22 minutes of patient care (this includes precharting, chart review, review of results, face-to-face care, etc.).

## 2020-06-26 NOTE — Patient Instructions (Addendum)
You were seen today by Lauraine Rinne, NP  for:   1. Chronic obstructive pulmonary disease, unspecified COPD type ([place])  Only use your albuterol as a rescue medication to be used if you can't catch your breath by resting or doing a relaxed purse lip breathing pattern.  - The less you use it, the better it will work when you need it. - Ok to use up to 2 puffs  every 4 hours if you must but call for immediate appointment if use goes up over your usual need - Don't leave home without it !!  (think of it like the spare tire for your car)   Note your daily symptoms > remember "red flags" for COPD:   >>>Increase in cough >>>increase in sputum production >>>increase in shortness of breath or activity  intolerance.   If you notice these symptoms, please call the office to be seen.    Follow Up:    Return in about 1 year (around 06/26/2021), or if symptoms worsen or fail to improve, for Follow up with Dr. Vaughan Browner.   Please do your part to reduce the spread of COVID-19:      Reduce your risk of any infection  and COVID19 by using the similar precautions used for avoiding the common cold or flu:  Marland Kitchen Wash your hands often with soap and warm water for at least 20 seconds.  If soap and water are not readily available, use an alcohol-based hand sanitizer with at least 60% alcohol.  . If coughing or sneezing, cover your mouth and nose by coughing or sneezing into the elbow areas of your shirt or coat, into a tissue or into your sleeve (not your hands). Langley Gauss A MASK when in public  . Avoid shaking hands with others and consider head nods or verbal greetings only. . Avoid touching your eyes, nose, or mouth with unwashed hands.  . Avoid close contact with people who are sick. . Avoid places or events with large numbers of people in one location, like concerts or sporting events. . If you have some symptoms but not all symptoms, continue to monitor at home and seek medical attention if your symptoms  worsen. . If you are having a medical emergency, call 911.   [place] / e-Visit: [email or web address]         [facility]: [facility]: [phone]                   [facility]: [phone]     It is flu season:   >>> Best ways to protect herself from the flu: Receive the yearly flu vaccine, practice good hand hygiene washing with soap and also using hand sanitizer when available, eat a nutritious meals, get adequate rest, hydrate appropriately   Please contact the office if your symptoms worsen or you have concerns that you are not improving.   Thank you for choosing [facility] for your healthcare, and for allowing [place] to partner with you on your healthcare journey. I am thankful to be able to provide care to you today.   Wyn Quaker FNP-C

## 2020-06-26 NOTE — Assessment & Plan Note (Signed)
Plan: Continue rescue inhaler We will have patient follow-up with our office in 1 year or sooner if patient symptoms worsen Work on increasing overall physical activity Work with physical therapy Present to our office sooner if you have acute changes with respiratory status

## 2020-06-27 DIAGNOSIS — M546 Pain in thoracic spine: Secondary | ICD-10-CM | POA: Diagnosis not present

## 2020-06-27 DIAGNOSIS — M542 Cervicalgia: Secondary | ICD-10-CM | POA: Diagnosis not present

## 2020-07-02 DIAGNOSIS — M542 Cervicalgia: Secondary | ICD-10-CM | POA: Diagnosis not present

## 2020-07-02 DIAGNOSIS — M546 Pain in thoracic spine: Secondary | ICD-10-CM | POA: Diagnosis not present

## 2020-07-04 DIAGNOSIS — M542 Cervicalgia: Secondary | ICD-10-CM | POA: Diagnosis not present

## 2020-07-04 DIAGNOSIS — M546 Pain in thoracic spine: Secondary | ICD-10-CM | POA: Diagnosis not present

## 2020-07-09 DIAGNOSIS — M542 Cervicalgia: Secondary | ICD-10-CM | POA: Diagnosis not present

## 2020-07-09 DIAGNOSIS — M546 Pain in thoracic spine: Secondary | ICD-10-CM | POA: Diagnosis not present

## 2020-07-11 ENCOUNTER — Other Ambulatory Visit: Payer: Self-pay | Admitting: Gastroenterology

## 2020-07-11 DIAGNOSIS — Z86010 Personal history of colonic polyps: Secondary | ICD-10-CM

## 2020-07-11 DIAGNOSIS — M542 Cervicalgia: Secondary | ICD-10-CM | POA: Diagnosis not present

## 2020-07-11 DIAGNOSIS — M546 Pain in thoracic spine: Secondary | ICD-10-CM | POA: Diagnosis not present

## 2020-07-22 DIAGNOSIS — M25521 Pain in right elbow: Secondary | ICD-10-CM | POA: Diagnosis not present

## 2020-07-22 DIAGNOSIS — M545 Low back pain: Secondary | ICD-10-CM | POA: Diagnosis not present

## 2020-07-24 DIAGNOSIS — S39012D Strain of muscle, fascia and tendon of lower back, subsequent encounter: Secondary | ICD-10-CM | POA: Diagnosis not present

## 2020-07-24 DIAGNOSIS — M7632 Iliotibial band syndrome, left leg: Secondary | ICD-10-CM | POA: Diagnosis not present

## 2020-07-26 ENCOUNTER — Encounter: Payer: Self-pay | Admitting: Cardiology

## 2020-07-29 DIAGNOSIS — E785 Hyperlipidemia, unspecified: Secondary | ICD-10-CM | POA: Diagnosis not present

## 2020-07-29 DIAGNOSIS — I2510 Atherosclerotic heart disease of native coronary artery without angina pectoris: Secondary | ICD-10-CM | POA: Diagnosis not present

## 2020-07-29 DIAGNOSIS — E7800 Pure hypercholesterolemia, unspecified: Secondary | ICD-10-CM | POA: Diagnosis not present

## 2020-07-29 DIAGNOSIS — I10 Essential (primary) hypertension: Secondary | ICD-10-CM | POA: Diagnosis not present

## 2020-07-29 DIAGNOSIS — J449 Chronic obstructive pulmonary disease, unspecified: Secondary | ICD-10-CM | POA: Diagnosis not present

## 2020-07-29 DIAGNOSIS — E1169 Type 2 diabetes mellitus with other specified complication: Secondary | ICD-10-CM | POA: Diagnosis not present

## 2020-07-30 DIAGNOSIS — S39012D Strain of muscle, fascia and tendon of lower back, subsequent encounter: Secondary | ICD-10-CM | POA: Diagnosis not present

## 2020-07-30 DIAGNOSIS — M7632 Iliotibial band syndrome, left leg: Secondary | ICD-10-CM | POA: Diagnosis not present

## 2020-07-31 ENCOUNTER — Ambulatory Visit: Payer: Medicare Other

## 2020-07-31 DIAGNOSIS — M7632 Iliotibial band syndrome, left leg: Secondary | ICD-10-CM | POA: Diagnosis not present

## 2020-07-31 DIAGNOSIS — S39012D Strain of muscle, fascia and tendon of lower back, subsequent encounter: Secondary | ICD-10-CM | POA: Diagnosis not present

## 2020-08-04 DIAGNOSIS — Z1211 Encounter for screening for malignant neoplasm of colon: Secondary | ICD-10-CM | POA: Diagnosis not present

## 2020-08-04 DIAGNOSIS — Z86010 Personal history of colonic polyps: Secondary | ICD-10-CM | POA: Diagnosis not present

## 2020-08-08 DIAGNOSIS — S39012D Strain of muscle, fascia and tendon of lower back, subsequent encounter: Secondary | ICD-10-CM | POA: Diagnosis not present

## 2020-08-08 DIAGNOSIS — M7632 Iliotibial band syndrome, left leg: Secondary | ICD-10-CM | POA: Diagnosis not present

## 2020-08-13 DIAGNOSIS — M7632 Iliotibial band syndrome, left leg: Secondary | ICD-10-CM | POA: Diagnosis not present

## 2020-08-13 DIAGNOSIS — S39012D Strain of muscle, fascia and tendon of lower back, subsequent encounter: Secondary | ICD-10-CM | POA: Diagnosis not present

## 2020-08-22 DIAGNOSIS — S39012D Strain of muscle, fascia and tendon of lower back, subsequent encounter: Secondary | ICD-10-CM | POA: Diagnosis not present

## 2020-08-22 DIAGNOSIS — M7632 Iliotibial band syndrome, left leg: Secondary | ICD-10-CM | POA: Diagnosis not present

## 2020-09-02 ENCOUNTER — Other Ambulatory Visit: Payer: Self-pay | Admitting: Orthopedic Surgery

## 2020-09-02 DIAGNOSIS — M546 Pain in thoracic spine: Secondary | ICD-10-CM

## 2020-09-02 DIAGNOSIS — M7632 Iliotibial band syndrome, left leg: Secondary | ICD-10-CM | POA: Diagnosis not present

## 2020-09-22 ENCOUNTER — Ambulatory Visit
Admission: RE | Admit: 2020-09-22 | Discharge: 2020-09-22 | Disposition: A | Discharge status: Home / Self Care | Payer: Medicare Other | Source: Ambulatory Visit | Attending: Orthopedic Surgery | Admitting: Orthopedic Surgery

## 2020-09-22 ENCOUNTER — Other Ambulatory Visit: Payer: Self-pay

## 2020-09-22 DIAGNOSIS — M5124 Other intervertebral disc displacement, thoracic region: Secondary | ICD-10-CM | POA: Diagnosis not present

## 2020-09-22 DIAGNOSIS — M546 Pain in thoracic spine: Secondary | ICD-10-CM

## 2020-09-22 DIAGNOSIS — M5134 Other intervertebral disc degeneration, thoracic region: Secondary | ICD-10-CM | POA: Diagnosis not present

## 2020-09-22 DIAGNOSIS — D1809 Hemangioma of other sites: Secondary | ICD-10-CM | POA: Diagnosis not present

## 2020-09-23 ENCOUNTER — Other Ambulatory Visit: Payer: Self-pay | Admitting: Cardiology

## 2020-09-23 NOTE — Telephone Encounter (Signed)
Rx has been sent to the pharmacy electronically. ° °

## 2020-09-30 DIAGNOSIS — M546 Pain in thoracic spine: Secondary | ICD-10-CM | POA: Diagnosis not present

## 2020-10-07 DIAGNOSIS — M546 Pain in thoracic spine: Secondary | ICD-10-CM | POA: Diagnosis not present

## 2020-10-07 DIAGNOSIS — M40294 Other kyphosis, thoracic region: Secondary | ICD-10-CM | POA: Diagnosis not present

## 2020-10-07 DIAGNOSIS — M2560 Stiffness of unspecified joint, not elsewhere classified: Secondary | ICD-10-CM | POA: Diagnosis not present

## 2020-10-15 DIAGNOSIS — M40294 Other kyphosis, thoracic region: Secondary | ICD-10-CM | POA: Diagnosis not present

## 2020-10-15 DIAGNOSIS — M546 Pain in thoracic spine: Secondary | ICD-10-CM | POA: Diagnosis not present

## 2020-10-15 DIAGNOSIS — M2560 Stiffness of unspecified joint, not elsewhere classified: Secondary | ICD-10-CM | POA: Diagnosis not present

## 2020-10-17 DIAGNOSIS — M2560 Stiffness of unspecified joint, not elsewhere classified: Secondary | ICD-10-CM | POA: Diagnosis not present

## 2020-10-17 DIAGNOSIS — E785 Hyperlipidemia, unspecified: Secondary | ICD-10-CM | POA: Diagnosis not present

## 2020-10-17 DIAGNOSIS — I10 Essential (primary) hypertension: Secondary | ICD-10-CM | POA: Diagnosis not present

## 2020-10-17 DIAGNOSIS — M8580 Other specified disorders of bone density and structure, unspecified site: Secondary | ICD-10-CM | POA: Diagnosis not present

## 2020-10-17 DIAGNOSIS — J449 Chronic obstructive pulmonary disease, unspecified: Secondary | ICD-10-CM | POA: Diagnosis not present

## 2020-10-17 DIAGNOSIS — E7800 Pure hypercholesterolemia, unspecified: Secondary | ICD-10-CM | POA: Diagnosis not present

## 2020-10-17 DIAGNOSIS — M546 Pain in thoracic spine: Secondary | ICD-10-CM | POA: Diagnosis not present

## 2020-10-17 DIAGNOSIS — E1169 Type 2 diabetes mellitus with other specified complication: Secondary | ICD-10-CM | POA: Diagnosis not present

## 2020-10-17 DIAGNOSIS — I2510 Atherosclerotic heart disease of native coronary artery without angina pectoris: Secondary | ICD-10-CM | POA: Diagnosis not present

## 2020-10-17 DIAGNOSIS — M40294 Other kyphosis, thoracic region: Secondary | ICD-10-CM | POA: Diagnosis not present

## 2020-10-22 DIAGNOSIS — M546 Pain in thoracic spine: Secondary | ICD-10-CM | POA: Diagnosis not present

## 2020-10-22 DIAGNOSIS — M2560 Stiffness of unspecified joint, not elsewhere classified: Secondary | ICD-10-CM | POA: Diagnosis not present

## 2020-10-22 DIAGNOSIS — M40294 Other kyphosis, thoracic region: Secondary | ICD-10-CM | POA: Diagnosis not present

## 2020-10-23 DIAGNOSIS — E1169 Type 2 diabetes mellitus with other specified complication: Secondary | ICD-10-CM | POA: Diagnosis not present

## 2020-10-23 DIAGNOSIS — I10 Essential (primary) hypertension: Secondary | ICD-10-CM | POA: Diagnosis not present

## 2020-10-23 DIAGNOSIS — E785 Hyperlipidemia, unspecified: Secondary | ICD-10-CM | POA: Diagnosis not present

## 2020-10-23 DIAGNOSIS — F419 Anxiety disorder, unspecified: Secondary | ICD-10-CM | POA: Diagnosis not present

## 2020-10-23 DIAGNOSIS — Z23 Encounter for immunization: Secondary | ICD-10-CM | POA: Diagnosis not present

## 2020-10-24 DIAGNOSIS — M546 Pain in thoracic spine: Secondary | ICD-10-CM | POA: Diagnosis not present

## 2020-10-24 DIAGNOSIS — M40294 Other kyphosis, thoracic region: Secondary | ICD-10-CM | POA: Diagnosis not present

## 2020-10-24 DIAGNOSIS — M2560 Stiffness of unspecified joint, not elsewhere classified: Secondary | ICD-10-CM | POA: Diagnosis not present

## 2020-10-28 DIAGNOSIS — M40294 Other kyphosis, thoracic region: Secondary | ICD-10-CM | POA: Diagnosis not present

## 2020-10-28 DIAGNOSIS — M546 Pain in thoracic spine: Secondary | ICD-10-CM | POA: Diagnosis not present

## 2020-10-28 DIAGNOSIS — M2560 Stiffness of unspecified joint, not elsewhere classified: Secondary | ICD-10-CM | POA: Diagnosis not present

## 2020-10-31 DIAGNOSIS — M40294 Other kyphosis, thoracic region: Secondary | ICD-10-CM | POA: Diagnosis not present

## 2020-10-31 DIAGNOSIS — M2560 Stiffness of unspecified joint, not elsewhere classified: Secondary | ICD-10-CM | POA: Diagnosis not present

## 2020-10-31 DIAGNOSIS — M546 Pain in thoracic spine: Secondary | ICD-10-CM | POA: Diagnosis not present

## 2020-11-05 DIAGNOSIS — M546 Pain in thoracic spine: Secondary | ICD-10-CM | POA: Diagnosis not present

## 2020-11-05 DIAGNOSIS — M2560 Stiffness of unspecified joint, not elsewhere classified: Secondary | ICD-10-CM | POA: Diagnosis not present

## 2020-11-05 DIAGNOSIS — M40294 Other kyphosis, thoracic region: Secondary | ICD-10-CM | POA: Diagnosis not present

## 2020-11-06 DIAGNOSIS — M546 Pain in thoracic spine: Secondary | ICD-10-CM | POA: Diagnosis not present

## 2020-11-07 DIAGNOSIS — M2560 Stiffness of unspecified joint, not elsewhere classified: Secondary | ICD-10-CM | POA: Diagnosis not present

## 2020-11-07 DIAGNOSIS — M546 Pain in thoracic spine: Secondary | ICD-10-CM | POA: Diagnosis not present

## 2020-11-07 DIAGNOSIS — M40294 Other kyphosis, thoracic region: Secondary | ICD-10-CM | POA: Diagnosis not present

## 2020-11-12 DIAGNOSIS — M2560 Stiffness of unspecified joint, not elsewhere classified: Secondary | ICD-10-CM | POA: Diagnosis not present

## 2020-11-12 DIAGNOSIS — M546 Pain in thoracic spine: Secondary | ICD-10-CM | POA: Diagnosis not present

## 2020-11-12 DIAGNOSIS — M40294 Other kyphosis, thoracic region: Secondary | ICD-10-CM | POA: Diagnosis not present

## 2020-11-15 DIAGNOSIS — M40294 Other kyphosis, thoracic region: Secondary | ICD-10-CM | POA: Diagnosis not present

## 2020-11-15 DIAGNOSIS — M546 Pain in thoracic spine: Secondary | ICD-10-CM | POA: Diagnosis not present

## 2020-11-15 DIAGNOSIS — M2560 Stiffness of unspecified joint, not elsewhere classified: Secondary | ICD-10-CM | POA: Diagnosis not present

## 2020-11-19 DIAGNOSIS — M40294 Other kyphosis, thoracic region: Secondary | ICD-10-CM | POA: Diagnosis not present

## 2020-11-19 DIAGNOSIS — M546 Pain in thoracic spine: Secondary | ICD-10-CM | POA: Diagnosis not present

## 2020-11-19 DIAGNOSIS — M2560 Stiffness of unspecified joint, not elsewhere classified: Secondary | ICD-10-CM | POA: Diagnosis not present

## 2020-11-22 DIAGNOSIS — M40294 Other kyphosis, thoracic region: Secondary | ICD-10-CM | POA: Diagnosis not present

## 2020-11-22 DIAGNOSIS — M2560 Stiffness of unspecified joint, not elsewhere classified: Secondary | ICD-10-CM | POA: Diagnosis not present

## 2020-11-22 DIAGNOSIS — M546 Pain in thoracic spine: Secondary | ICD-10-CM | POA: Diagnosis not present

## 2020-11-27 DIAGNOSIS — M2560 Stiffness of unspecified joint, not elsewhere classified: Secondary | ICD-10-CM | POA: Diagnosis not present

## 2020-11-27 DIAGNOSIS — M546 Pain in thoracic spine: Secondary | ICD-10-CM | POA: Diagnosis not present

## 2020-11-27 DIAGNOSIS — M40294 Other kyphosis, thoracic region: Secondary | ICD-10-CM | POA: Diagnosis not present

## 2020-12-03 DIAGNOSIS — M2560 Stiffness of unspecified joint, not elsewhere classified: Secondary | ICD-10-CM | POA: Diagnosis not present

## 2020-12-03 DIAGNOSIS — M546 Pain in thoracic spine: Secondary | ICD-10-CM | POA: Diagnosis not present

## 2020-12-03 DIAGNOSIS — M40294 Other kyphosis, thoracic region: Secondary | ICD-10-CM | POA: Diagnosis not present

## 2020-12-17 DIAGNOSIS — E7800 Pure hypercholesterolemia, unspecified: Secondary | ICD-10-CM | POA: Diagnosis not present

## 2020-12-17 DIAGNOSIS — J449 Chronic obstructive pulmonary disease, unspecified: Secondary | ICD-10-CM | POA: Diagnosis not present

## 2020-12-17 DIAGNOSIS — E785 Hyperlipidemia, unspecified: Secondary | ICD-10-CM | POA: Diagnosis not present

## 2020-12-17 DIAGNOSIS — I2510 Atherosclerotic heart disease of native coronary artery without angina pectoris: Secondary | ICD-10-CM | POA: Diagnosis not present

## 2020-12-17 DIAGNOSIS — I10 Essential (primary) hypertension: Secondary | ICD-10-CM | POA: Diagnosis not present

## 2020-12-17 DIAGNOSIS — M8580 Other specified disorders of bone density and structure, unspecified site: Secondary | ICD-10-CM | POA: Diagnosis not present

## 2020-12-17 DIAGNOSIS — E1169 Type 2 diabetes mellitus with other specified complication: Secondary | ICD-10-CM | POA: Diagnosis not present

## 2021-01-27 ENCOUNTER — Other Ambulatory Visit: Payer: Self-pay | Admitting: Physician Assistant

## 2021-01-27 DIAGNOSIS — R634 Abnormal weight loss: Secondary | ICD-10-CM | POA: Diagnosis not present

## 2021-01-27 DIAGNOSIS — K589 Irritable bowel syndrome without diarrhea: Secondary | ICD-10-CM

## 2021-01-27 DIAGNOSIS — R110 Nausea: Secondary | ICD-10-CM

## 2021-01-27 DIAGNOSIS — R1013 Epigastric pain: Secondary | ICD-10-CM | POA: Diagnosis not present

## 2021-01-27 DIAGNOSIS — K5900 Constipation, unspecified: Secondary | ICD-10-CM | POA: Diagnosis not present

## 2021-01-27 DIAGNOSIS — R197 Diarrhea, unspecified: Secondary | ICD-10-CM | POA: Diagnosis not present

## 2021-01-30 ENCOUNTER — Ambulatory Visit
Admission: RE | Admit: 2021-01-30 | Discharge: 2021-01-30 | Disposition: A | Discharge status: Home / Self Care | Payer: Medicare Other | Source: Ambulatory Visit | Attending: Physician Assistant | Admitting: Physician Assistant

## 2021-01-30 DIAGNOSIS — K224 Dyskinesia of esophagus: Secondary | ICD-10-CM | POA: Diagnosis not present

## 2021-01-30 DIAGNOSIS — R110 Nausea: Secondary | ICD-10-CM

## 2021-01-30 DIAGNOSIS — K449 Diaphragmatic hernia without obstruction or gangrene: Secondary | ICD-10-CM | POA: Diagnosis not present

## 2021-02-07 ENCOUNTER — Encounter: Payer: Self-pay | Admitting: Cardiology

## 2021-02-07 ENCOUNTER — Other Ambulatory Visit: Payer: Self-pay

## 2021-02-07 ENCOUNTER — Ambulatory Visit (INDEPENDENT_AMBULATORY_CARE_PROVIDER_SITE_OTHER): Payer: Medicare Other | Admitting: Cardiology

## 2021-02-07 VITALS — BP 120/60 | HR 66 | Ht 65.0 in | Wt 138.4 lb

## 2021-02-07 DIAGNOSIS — I700 Atherosclerosis of aorta: Secondary | ICD-10-CM

## 2021-02-07 DIAGNOSIS — I10 Essential (primary) hypertension: Secondary | ICD-10-CM

## 2021-02-07 DIAGNOSIS — I2510 Atherosclerotic heart disease of native coronary artery without angina pectoris: Secondary | ICD-10-CM | POA: Diagnosis not present

## 2021-02-07 DIAGNOSIS — I517 Cardiomegaly: Secondary | ICD-10-CM

## 2021-02-07 DIAGNOSIS — I739 Peripheral vascular disease, unspecified: Secondary | ICD-10-CM

## 2021-02-07 DIAGNOSIS — E782 Mixed hyperlipidemia: Secondary | ICD-10-CM

## 2021-02-07 NOTE — Progress Notes (Signed)
Cardiology Office Note:    Date:  02/07/2021   ID:  Deborah Waller, DOB November 08, 1941, MRN [id number]  PCP:  Donald Prose, MD  Cardiologist:  Buford Dresser, MD PhD  Referring MD: Donald Prose, MD   CC: follow up  History of Present Illness:    Deborah Waller (Pronouced ko-TONE) is a 80 y.o. female with a hx of PMH mild COPD, pulmondary nodules (followed by Dr. Vaughan Browner), PAD with prior bilateral bypass surgery followed by Dr. Lucky Cowboy, hyperlipidemia, reported hypertension but on no meds, former tobacco abuse, claustrophobia/anxiety, neuropathy. (Denies history of type II diabetes, last A1c 5.9) who is seen for follow up today. Initial consultation (virtual) with me on 04/27/19.  Today: Ate breakfast this AM (usual breakfast), felt like her whole body tingled after. Slowly improving and BP was stable at the time. Has never happened before.   Moving to [place] (near [place], not far from [place]). We looked up options for cardiovascular care in the area. Her family is all from this area, and her twin sister lives in the area. She and her husband plan to move in the near future.  Overall has been doing well. Blood pressure has been doing well.  Denies chest pain, shortness of breath at rest or with normal exertion. No PND, orthopnea, LE edema or unexpected weight gain. No syncope or palpitations.    Past Medical History:  Diagnosis Date   Anxiety    Neuropathy of both feet     Past Surgical History:  Procedure Laterality Date   ABDOMINAL HYSTERECTOMY  1972   ANKLE RECONSTRUCTION Left 12/2007   BREAST EXCISIONAL BIOPSY Right    BREAST LUMPECTOMY Right    benign- 2 lumps removed    BUNIONECTOMY Left 2002, 2003   CATARACT EXTRACTION Left 11/2001   CATARACT EXTRACTION Right 10/2001   GALLBLADDER SURGERY  2000   leg bypass- left  03/04/2011   leg bypass-right   04/22/2011   LIPOMA EXCISION  07/18/2014   OOPHORECTOMY  1995   REPLACEMENT TOTAL KNEE Right 12/08/2011    TOE SURGERY Left 2010   TOTAL HIP ARTHROPLASTY Right 11/13/2008   TOTAL HIP ARTHROPLASTY Left 06/2005    Current Medications: Current Outpatient Medications on File Prior to Visit  Medication Sig   albuterol (VENTOLIN HFA) 108 (90 Base) MCG/ACT inhaler Inhale 2 puffs into the lungs every 6 (six) hours as needed for wheezing or shortness of breath.   amLODipine (NORVASC) 5 MG tablet Take 1 tablet (5 mg total) by mouth daily.   aspirin EC 81 MG tablet Take 1 tablet (81 mg total) by mouth daily.   atorvastatin (LIPITOR) 40 MG tablet TAKE 1 TABLET(40 MG) BY MOUTH DAILY   Blood Glucose Monitoring Suppl (ACCU-CHEK AVIVA PLUS) w/Device KIT USE SEVERAL TIMES A WEEK UTD   Cholecalciferol (VITAMIN D) 2000 units CAPS Take 2,000 Units by mouth daily.   dicyclomine (BENTYL) 20 MG tablet TK 1 T PO QID PRN   metoprolol succinate (TOPROL-XL) 25 MG 24 hr tablet TAKE 1 TABLET BY MOUTH DAILY WITH OR IMMEDIATELY FOLLOWING A MEAL   omeprazole (PRILOSEC) 20 MG capsule Take 1 capsule by mouth daily.   ondansetron (ZOFRAN-ODT) 4 MG disintegrating tablet Take 1 tablet (4 mg total) by mouth every 8 (eight) hours as needed for nausea or vomiting.   pregabalin (LYRICA) 150 MG capsule Take 150 mg by mouth daily.    TRINTELLIX 20 MG TABS tablet Take 1 tablet by mouth daily.    No current  facility-administered medications on file prior to visit.     Allergies:   Oxycodone   Social History   Tobacco Use   Smoking status: Former Smoker    Packs/day: 1.00    Years: 33.00    Pack years: 33.00    Types: Cigarettes    Quit date: 11/10/1987    Years since quitting: 33.2   Smokeless tobacco: Never Used  Substance Use Topics   Alcohol use: No   Drug use: No    Family History: The patient's family history includes Allergies in her sister; Breast cancer in her maternal aunt.  ROS:   Please see the history of present illness.  Additional pertinent ROS negative except as noted.  EKGs/Labs/Other Studies Reviewed:     The following studies were reviewed today: CT cardiac 07/26/19 FINDINGS: Coronary calcium score: The patient's coronary artery calcium score is 1257, which places the patient in the 95 percentile.   Coronary arteries: Normal coronary origins.  Right dominance.   Right Coronary Artery: There is calcified plaque in the proximal RCA with 1-24% stenosis, followed by another calcified plaque with 25-49% stenosis. The mid RCA has mixed plaque with 25-49% stenosis.   Left Main Coronary Artery: Calcified plaque, 1-24% stenosis   Left Anterior Descending Coronary Artery: Proximal portion with calcified plaque, 1-24% stenosis. Midportion with calcified plaque, 1-24% stenosis. Distal portion without significant stenosis.   Left Circumflex Artery: There is a mixed calcified and noncalcified plaque in the proximal LCx with 1-24% stenosis, followed by a calcified plaque with 25-49% stenosis. There is another calcified plaque in the mid to distal segment of the LCx with 25-49% stenosis.   Aorta: Normal size, 29 mm at the mid ascending aorta (level of the PA bifurcation) measured double oblique. Scattered calcifications, especially surrounding coronary ostia. No dissection.   Aortic Valve: No calcifications. Trileaflet.   Other findings:   Normal pulmonary vein drainage into the left atrium.   Normal left atrial appendage without a thrombus.   Normal size of the pulmonary artery (Upper limit of normal).   IMPRESSION: 1.  Mild nonobstructive CAD, CADRADS = 2.   2. Coronary calcium score of 1257. This was 95 percentile for age and sex matched control.   3. Normal coronary origin with right dominance.  Echo 06/13/19 1. The left ventricle has hyperdynamic systolic function, with an ejection fraction of >65%. The cavity size was normal. There is severe asymmetric left ventricular hypertrophy. Left ventricular diastolic Doppler parameters are consistent with impaired  relaxation.  2. The  right ventricle has normal systolic function. The cavity was normal. There is no increase in right ventricular wall thickness.  3. Trivial pericardial effusion is present.  4. No stenosis of the aortic valve.  5. The aorta is normal in size and structure.  6. The aortic root and ascending aorta are normal in size and structure.  7. Grossly normal.  8. The average left ventricular global longitudinal strain is -14.8 %.  EKG:  EKG is personally reviewed.  The ekg ordered today demonstrates NSR with nonspecific ST changes, HR 66 bpm  Recent Labs: No results found for requested labs within last 8760 hours.  Recent Lipid Panel No results found for: CHOL, TRIG, HDL, CHOLHDL, VLDL, LDLCALC, LDLDIRECT  Physical Exam:    VS:  BP 120/60   Pulse 66   Ht 5' 5"  (1.651 m)   Wt 138 lb 6.4 oz (62.8 kg)   BMI 23.03 kg/m     Wt Readings from Last  3 Encounters:  02/07/21 138 lb 6.4 oz (62.8 kg)  06/26/20 140 lb 6.4 oz (63.7 kg)  05/17/20 138 lb (62.6 kg)    GEN: Well nourished, well developed in no acute distress HEENT: Normal, moist mucous membranes NECK: No JVD CARDIAC: regular rhythm, normal S1 and S2, no rubs or gallops. 1/6 SEM today. VASCULAR: Radial and DP pulses 2+ bilaterally. No carotid bruits RESPIRATORY:  Clear to auscultation without rales, wheezing or rhonchi  ABDOMEN: Soft, non-tender, non-distended MUSCULOSKELETAL:  Ambulates independently SKIN: Warm and dry, + L ankle bursa NEUROLOGIC:  Alert and oriented x 3. No focal neuro deficits noted. PSYCHIATRIC:  Normal affect    ASSESSMENT:    1. Essential hypertension   2. Nonocclusive coronary atherosclerosis of native coronary artery   3. LVH (left ventricular hypertrophy)   4. Aortic atherosclerosis ([place])   5. Mixed hyperlipidemia   6. PVD (peripheral vascular disease) ([facility])    PLAN:   Severe asymmetric LV hypertrophy, with septum 1.96 cm:  -no syncope -tolerating metoprolol succinate 25 mg daily -avoid  dehydration  Left chest/upper extremity/back pain -ct cardiac reviewed today. Has significant calcium but CAD is nonobstructive -sharp in nature, with moving/using arms, consider MSK  Aortic atherosclerosis, calcified nonobstructive CAD -continue aspirin 81 mg daily -continue statin  Mixed hyperlipidemia -continue atorvastatin 40 mg daily  PVD: -follows with vascular surgery, ABIs pending  Plan for follow up:  she is moving, looking to establish care. Happy to send records if needed once she is established with a new provider.  Medication Adjustments/Labs and Tests Ordered: Current medicines are reviewed at length with the patient today.  Concerns regarding medicines are outlined above.  Orders Placed This Encounter  Procedures   EKG 12-Lead   No orders of the defined types were placed in this encounter.   Patient Instructions  Medication Instructions:  Your Physician recommend you continue on your current medication as directed.    *If you need a refill on your cardiac medications before your next appointment, please call your pharmacy*   Lab Work: None   Testing/Procedures: None   Follow-Up: At [facility], you and your health needs are our priority.  As part of our continuing mission to provide you with exceptional heart care, we have created designated Provider Care Teams.  These Care Teams include your primary Cardiologist (physician) and Advanced Practice Providers (APPs -  Physician Assistants and Nurse Practitioners) who all work together to provide you with the care you need, when you need it.  We recommend signing up for the patient portal called "MyChart".  Sign up information is provided on this After Visit Summary.  MyChart is used to connect with patients for Virtual Visits (Telemedicine).  Patients are able to view lab/test results, encounter notes, upcoming appointments, etc.  Non-urgent messages can be sent to your provider as well.   To learn more  about what you can do with MyChart, go to [email or web address].    Your next appointment:   As needed  The format for your next appointment:   In Person  Provider:   Buford Dresser, MD     Signed, Buford Dresser, MD PhD 02/07/2021 9:36 PM    [place]

## 2021-02-07 NOTE — Patient Instructions (Signed)

## 2021-02-28 DIAGNOSIS — R110 Nausea: Secondary | ICD-10-CM | POA: Diagnosis not present

## 2021-02-28 DIAGNOSIS — K589 Irritable bowel syndrome without diarrhea: Secondary | ICD-10-CM | POA: Diagnosis not present

## 2021-02-28 DIAGNOSIS — K219 Gastro-esophageal reflux disease without esophagitis: Secondary | ICD-10-CM | POA: Diagnosis not present

## 2021-03-05 ENCOUNTER — Ambulatory Visit (INDEPENDENT_AMBULATORY_CARE_PROVIDER_SITE_OTHER): Payer: Medicare Other

## 2021-03-05 ENCOUNTER — Encounter (INDEPENDENT_AMBULATORY_CARE_PROVIDER_SITE_OTHER): Payer: Self-pay

## 2021-03-05 ENCOUNTER — Encounter (INDEPENDENT_AMBULATORY_CARE_PROVIDER_SITE_OTHER): Payer: Self-pay | Admitting: Nurse Practitioner

## 2021-03-05 ENCOUNTER — Other Ambulatory Visit: Payer: Self-pay

## 2021-03-05 ENCOUNTER — Ambulatory Visit (INDEPENDENT_AMBULATORY_CARE_PROVIDER_SITE_OTHER): Payer: Medicare Other | Admitting: Nurse Practitioner

## 2021-03-05 VITALS — BP 108/50 | HR 52 | Ht 65.0 in | Wt 138.0 lb

## 2021-03-05 DIAGNOSIS — I739 Peripheral vascular disease, unspecified: Secondary | ICD-10-CM

## 2021-03-05 DIAGNOSIS — E785 Hyperlipidemia, unspecified: Secondary | ICD-10-CM | POA: Insufficient documentation

## 2021-03-05 DIAGNOSIS — E7800 Pure hypercholesterolemia, unspecified: Secondary | ICD-10-CM

## 2021-03-05 DIAGNOSIS — I2510 Atherosclerotic heart disease of native coronary artery without angina pectoris: Secondary | ICD-10-CM | POA: Insufficient documentation

## 2021-03-05 DIAGNOSIS — R110 Nausea: Secondary | ICD-10-CM | POA: Insufficient documentation

## 2021-03-05 DIAGNOSIS — M7989 Other specified soft tissue disorders: Secondary | ICD-10-CM

## 2021-03-05 DIAGNOSIS — K219 Gastro-esophageal reflux disease without esophagitis: Secondary | ICD-10-CM | POA: Insufficient documentation

## 2021-03-05 DIAGNOSIS — R413 Other amnesia: Secondary | ICD-10-CM | POA: Insufficient documentation

## 2021-03-05 DIAGNOSIS — Z860100 Personal history of colon polyps, unspecified: Secondary | ICD-10-CM | POA: Insufficient documentation

## 2021-03-05 DIAGNOSIS — J309 Allergic rhinitis, unspecified: Secondary | ICD-10-CM | POA: Insufficient documentation

## 2021-03-05 DIAGNOSIS — I10 Essential (primary) hypertension: Secondary | ICD-10-CM | POA: Diagnosis not present

## 2021-03-05 DIAGNOSIS — R634 Abnormal weight loss: Secondary | ICD-10-CM | POA: Insufficient documentation

## 2021-03-05 DIAGNOSIS — F419 Anxiety disorder, unspecified: Secondary | ICD-10-CM | POA: Insufficient documentation

## 2021-03-05 DIAGNOSIS — G619 Inflammatory polyneuropathy, unspecified: Secondary | ICD-10-CM | POA: Insufficient documentation

## 2021-03-05 DIAGNOSIS — R269 Unspecified abnormalities of gait and mobility: Secondary | ICD-10-CM | POA: Insufficient documentation

## 2021-03-05 DIAGNOSIS — K5900 Constipation, unspecified: Secondary | ICD-10-CM | POA: Insufficient documentation

## 2021-03-05 DIAGNOSIS — E559 Vitamin D deficiency, unspecified: Secondary | ICD-10-CM | POA: Insufficient documentation

## 2021-03-05 DIAGNOSIS — R911 Solitary pulmonary nodule: Secondary | ICD-10-CM | POA: Insufficient documentation

## 2021-03-05 DIAGNOSIS — M8580 Other specified disorders of bone density and structure, unspecified site: Secondary | ICD-10-CM | POA: Insufficient documentation

## 2021-03-05 DIAGNOSIS — R1013 Epigastric pain: Secondary | ICD-10-CM | POA: Insufficient documentation

## 2021-03-16 ENCOUNTER — Encounter (INDEPENDENT_AMBULATORY_CARE_PROVIDER_SITE_OTHER): Payer: Self-pay | Admitting: Nurse Practitioner

## 2021-03-16 NOTE — Progress Notes (Signed)
Subjective:    Patient ID: Deborah Waller, female    DOB: 09/29/1941, 80 y.o.   MRN: [id number] Chief Complaint  Patient presents with  . Follow-up    1 yr U/S    The patient returns to the office for followup and review of the noninvasive studies. There have been no interval changes in lower extremity symptoms. No interval shortening of the patient's claudication distance or development of rest pain symptoms. No new ulcers or wounds have occurred since the last visit.  The patient has a history of bilateral femoropopliteal bypass grafts done in [facility].  The patient has not had any issues with leg swelling.  The patient notes that she will be relocating to [place] in the next few months and establishing care with a vascular surgeon in the area.  There have been no significant changes to the patient's overall health care.  The patient denies amaurosis fugax or recent TIA symptoms. There are no recent neurological changes noted. The patient denies history of DVT, PE or superficial thrombophlebitis. The patient denies recent episodes of angina or shortness of breath.   ABI Rt=1.11 and Lt=1.13  (previous ABI's Rt=1.12 and Lt=1.06) Duplex ultrasound of the bilateral tibial arteries reveals triphasic waveforms with good toe waveforms.   Review of Systems  Cardiovascular: Negative for leg swelling.       Denies claudication  Skin: Negative for wound.  All other systems reviewed and are negative.      Objective:   Physical Exam Vitals reviewed.  HENT:     Head: Normocephalic.  Cardiovascular:     Rate and Rhythm: Normal rate.     Pulses: Normal pulses.  Pulmonary:     Effort: Pulmonary effort is normal.  Musculoskeletal:     Right lower leg: No edema.     Left lower leg: No edema.  Skin:    General: Skin is warm and dry.  Neurological:     Mental Status: She is alert and oriented to person, place, and time.  Psychiatric:        Mood and  Affect: Mood normal.        Behavior: Behavior normal.        Thought Content: Thought content normal.        Judgment: Judgment normal.     BP (!) 108/50   Pulse (!) 52   Ht 5\' 5"  (1.651 m)   Wt 138 lb (62.6 kg)   BMI 22.96 kg/m   Past Medical History:  Diagnosis Date  . Anxiety   . Neuropathy of both feet     Social History   Socioeconomic History  . Marital status: Married    Spouse name: Not on file  . Number of children: Not on file  . Years of education: Not on file  . Highest education level: Not on file  Occupational History  . Not on file  Tobacco Use  . Smoking status: Former Smoker    Packs/day: 1.00    Years: 33.00    Pack years: 33.00    Types: Cigarettes    Quit date: 11/10/1987    Years since quitting: 33.3  . Smokeless tobacco: Never Used  Substance and Sexual Activity  . Alcohol use: No  . Drug use: No  . Sexual activity: Not on file  Other Topics Concern  . Not on file  Social History Narrative   Married, lives with husband, has one child, and is retired.  Social Determinants of Health   Financial Resource Strain: Not on file  Food Insecurity: Not on file  Transportation Needs: Not on file  Physical Activity: Not on file  Stress: Not on file  Social Connections: Not on file  Intimate Partner Violence: Not on file    Past Surgical History:  Procedure Laterality Date  . ABDOMINAL HYSTERECTOMY  1972  . ANKLE RECONSTRUCTION Left 12/2007  . BREAST EXCISIONAL BIOPSY Right   . BREAST LUMPECTOMY Right    benign- 2 lumps removed   . BUNIONECTOMY Left 2002, 2003  . CATARACT EXTRACTION Left 11/2001  . CATARACT EXTRACTION Right 10/2001  . GALLBLADDER SURGERY  2000  . leg bypass- left  03/04/2011  . leg bypass-right   04/22/2011  . LIPOMA EXCISION  07/18/2014  . OOPHORECTOMY  1995  . REPLACEMENT TOTAL KNEE Right 12/08/2011  . TOE SURGERY Left 2010  . TOTAL HIP ARTHROPLASTY Right 11/13/2008  . TOTAL HIP ARTHROPLASTY Left 06/2005     Family History  Problem Relation Age of Onset  . Allergies Sister   . Breast cancer Maternal Aunt     Allergies  Allergen Reactions  . Crestor [Rosuvastatin] Other (See Comments)  . Escitalopram Oxalate Other (See Comments)  . Oxycodone     Childhood reaction, does not remember the reaction.   . Sertraline Hcl Other (See Comments)  . Simvastatin Other (See Comments)    CBC Latest Ref Rng & Units 10/24/2019 03/24/2019  WBC 4.0 - 10.5 K/uL 12.0(H) 8.9  Hemoglobin 12.0 - 15.0 g/dL 13.9 14.5  Hematocrit 36.0 - 46.0 % 42.1 42.4  Platelets 150 - 400 K/uL 264 287.0      CMP     Component Value Date/Time   NA 140 10/24/2019 1750   NA 142 07/19/2019 1142   K 3.2 (L) 10/24/2019 1750   CL 102 10/24/2019 1750   CO2 27 10/24/2019 1750   GLUCOSE 134 (H) 10/24/2019 1750   BUN 18 10/24/2019 1750   BUN 17 07/19/2019 1142   CREATININE 0.69 10/24/2019 1750   CREATININE 0.89 03/24/2019 1526   CALCIUM 9.0 10/24/2019 1750   PROT 6.7 10/24/2019 1750   ALBUMIN 3.7 10/24/2019 1750   AST 19 10/24/2019 1750   ALT 10 10/24/2019 1750   ALKPHOS 109 10/24/2019 1750   BILITOT 0.8 10/24/2019 1750   GFRNONAA >60 10/24/2019 1750   GFRAA >60 10/24/2019 1750     VAS [place] ABI WITH/WO TBI  Result Date: 03/06/2021  LOWER EXTREMITY DOPPLER STUDY Patient Name:  Deborah Waller  Date of Exam:   03/05/2021 Medical Rec #: [id number]            Accession #:    [id number] Date of Birth: April 09, 1941            Patient Gender: F Patient Age:   53Y Exam Location:  [facility] Procedure:      VAS [place] ABI WITH/WO TBI Referring Phys: [id number] [place] --------------------------------------------------------------------------------  Indications: Peripheral artery disease.  Vascular Interventions: H/O bilateral femoral-popliteal artery bypass vein                         grafts in [place], [place]. Comparison Study: 05/02/2020 Performing Technologist: Concha Norway RVT  Examination Guidelines: A  complete evaluation includes at minimum, Doppler waveform signals and systolic blood pressure reading at the level of bilateral brachial, anterior tibial, and posterior tibial arteries, when vessel segments are accessible. Bilateral testing is considered  an integral part of a complete examination. Photoelectric Plethysmograph (PPG) waveforms and toe systolic pressure readings are included as required and additional duplex testing as needed. Limited examinations for reoccurring indications may be performed as noted.  ABI Findings: +---------+------------------+-----+---------+--------+ Right    Rt Pressure (mmHg)IndexWaveform Comment  +---------+------------------+-----+---------+--------+ Brachial 136                                      +---------+------------------+-----+---------+--------+ ATA      147               1.08 triphasic         +---------+------------------+-----+---------+--------+ PTA      151               1.11 triphasic         +---------+------------------+-----+---------+--------+ Great Toe135               0.99 Normal            +---------+------------------+-----+---------+--------+ +---------+------------------+-----+---------+-------+ Left     Lt Pressure (mmHg)IndexWaveform Comment +---------+------------------+-----+---------+-------+ ATA      149               1.10 triphasic        +---------+------------------+-----+---------+-------+ PTA      153               1.12 triphasic        +---------+------------------+-----+---------+-------+ Adair Patter               0.98 Normal           +---------+------------------+-----+---------+-------+ +-------+-----------+-----------+------------+------------+ ABI/TBIToday's ABIToday's TBIPrevious ABIPrevious TBI +-------+-----------+-----------+------------+------------+ Right  1.11       .99        1.12        1.00         +-------+-----------+-----------+------------+------------+  Left   1.13       .98        1.06        1.06         +-------+-----------+-----------+------------+------------+ Bilateral ABIs and TBIs appear essentially unchanged compared to prior study on 05/02/2020.  Summary: Right: Resting right ankle-brachial index is within normal range. No evidence of significant right lower extremity arterial disease. The right toe-brachial index is normal. Left: Resting left ankle-brachial index is within normal range. No evidence of significant left lower extremity arterial disease. The left toe-brachial index is normal.  *See table(s) above for measurements and observations.  Electronically signed by Hortencia Pilar MD on 03/06/2021 at 5:50:52 PM.    Final        Assessment & Plan:   1. PVD (peripheral vascular disease) ([place])  Recommend:  The patient has evidence of atherosclerosis of the lower extremities with claudication.  The patient does not voice lifestyle limiting changes at this point in time.  Noninvasive studies do not suggest clinically significant change.  No invasive studies, angiography or surgery at this time The patient should continue walking and begin a more formal exercise program.  The patient should continue antiplatelet therapy and aggressive treatment of the lipid abnormalities  No changes in the patient's medications at this time  The patient should continue wearing graduated compression socks 10-15 mmHg strength to control the mild edema.   The patient will follow up on an as-needed basis due to her relocating to [place]. 2. Essential hypertension Continue antihypertensive medications as already ordered, these medications  have been reviewed and there are no changes at this time.   3. Pure hypercholesterolemia Continue statin as ordered and reviewed, no changes at this time   4. Leg swelling Patient is doing well with her lower extremity edema.  Patient will continue with conservative therapy measures as  needed.   Current Outpatient Medications on File Prior to Visit  Medication Sig Dispense Refill  . albuterol (VENTOLIN HFA) 108 (90 Base) MCG/ACT inhaler Inhale 2 puffs into the lungs every 6 (six) hours as needed for wheezing or shortness of breath. 18 g 3  . amLODipine (NORVASC) 5 MG tablet Take 1 tablet (5 mg total) by mouth daily. 30 tablet 11  . aspirin EC 81 MG tablet Take 1 tablet (81 mg total) by mouth daily. 90 tablet 3  . atorvastatin (LIPITOR) 40 MG tablet TAKE 1 TABLET(40 MG) BY MOUTH DAILY 90 tablet 2  . Cholecalciferol (VITAMIN D) 2000 units CAPS Take 2,000 Units by mouth daily.    Marland Kitchen dicyclomine (BENTYL) 20 MG tablet TK 1 T PO QID PRN  3  . metoprolol succinate (TOPROL-XL) 25 MG 24 hr tablet TAKE 1 TABLET BY MOUTH DAILY WITH OR IMMEDIATELY FOLLOWING A MEAL 90 tablet 3  . omeprazole (PRILOSEC) 20 MG capsule Take 1 capsule by mouth daily.    . ondansetron (ZOFRAN) 4 MG tablet Take 4 mg by mouth daily.    . pregabalin (LYRICA) 150 MG capsule Take 150 mg by mouth daily.     . TRINTELLIX 20 MG TABS tablet Take 1 tablet by mouth daily.      No current facility-administered medications on file prior to visit.    There are no Patient Instructions on file for this visit. No follow-ups on file.   Kris Hartmann, NP

## 2021-04-30 ENCOUNTER — Encounter (INDEPENDENT_AMBULATORY_CARE_PROVIDER_SITE_OTHER): Payer: Medicare Other

## 2021-04-30 ENCOUNTER — Ambulatory Visit (INDEPENDENT_AMBULATORY_CARE_PROVIDER_SITE_OTHER): Payer: Medicare Other | Admitting: Nurse Practitioner

## 2021-05-08 ENCOUNTER — Encounter: Payer: Self-pay | Admitting: Cardiology

## 2021-05-30 ENCOUNTER — Other Ambulatory Visit: Payer: Self-pay | Admitting: Cardiology

## 2021-05-30 DIAGNOSIS — I10 Essential (primary) hypertension: Secondary | ICD-10-CM

## 2022-02-16 ENCOUNTER — Telehealth (INDEPENDENT_AMBULATORY_CARE_PROVIDER_SITE_OTHER): Payer: Self-pay | Admitting: Nurse Practitioner

## 2022-02-16 NOTE — Telephone Encounter (Signed)
Patient will be contacting Dr Monico Blitz office and have them send medical records request ?

## 2022-02-16 NOTE — Telephone Encounter (Signed)
Patient called and stated they have moved to [place] and  would like her medical records fax over to Dr. Monico Blitz at [phone]. Pt. New number is [phone] ?

## 2022-09-07 ENCOUNTER — Encounter (INDEPENDENT_AMBULATORY_CARE_PROVIDER_SITE_OTHER): Payer: Self-pay

## 2022-11-06 IMAGING — MR MR THORACIC SPINE W/O CM
4 of 7 series · 20 of 48 positions shown · non-contrast
Comparison: None.

CLINICAL DATA: Mid back pain for 3 months radiating to both sides.

EXAM:
MRI THORACIC SPINE WITHOUT CONTRAST
TECHNIQUE: Multiplanar, multisequence MR imaging of the thoracic spine was
performed. No intravenous contrast was administered.

[Series 17: T1 · sagittal · 3.0mm · 0.83mm/px · 3 of 15 slices shown]
[im 1/15]
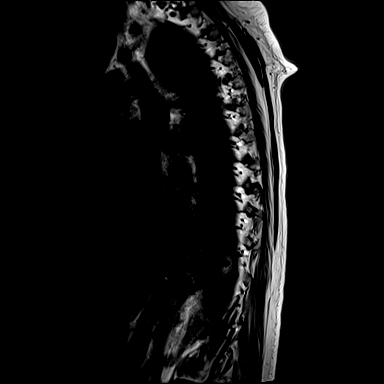
[im 10/15]
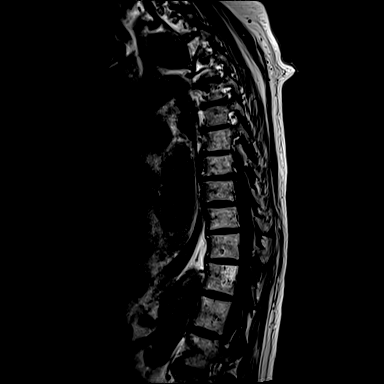
[im 15/15]
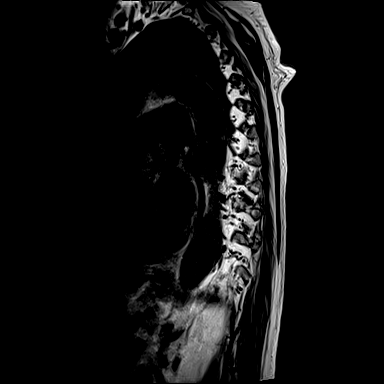

[Series 18: T2 · sagittal · 3.0mm · 0.83mm/px · 4 of 15 slices shown (1 of 2)]
[im 1/15]
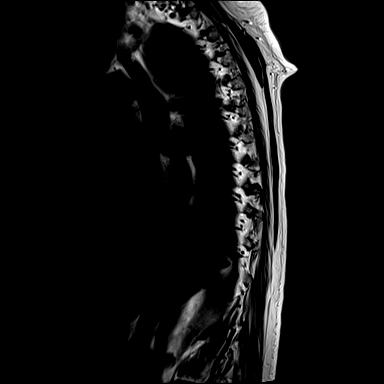
[im 5/15]
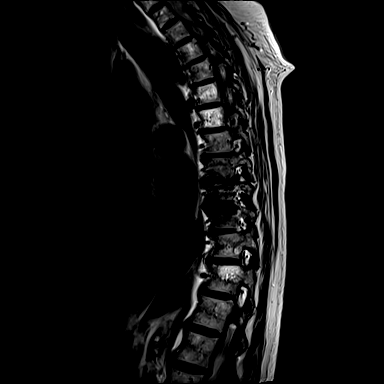
[im 10/15]
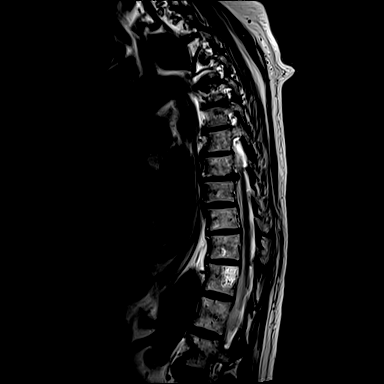
[im 15/15]
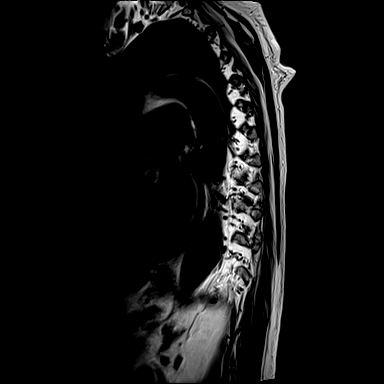

[Series 19: STIR · sagittal · 3.0mm · 1.00mm/px · 3 of 15 slices shown]
[im 1/15]
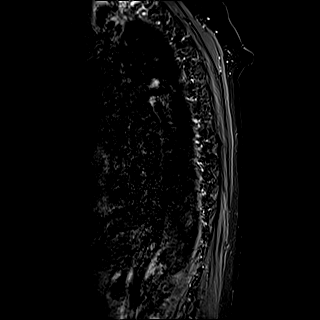
[im 10/15]
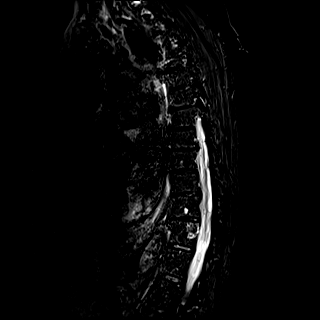
[im 15/15]
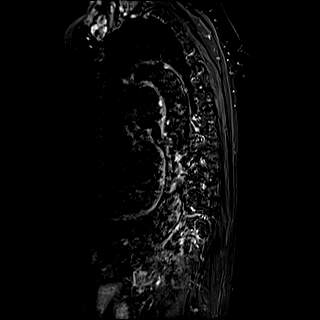

[Series 20: T2 · axial · 4.0mm · 0.28mm/px · z∈[-258,-75]mm · 10 of 40 slices shown (2 of 2)]
[im 1/40]
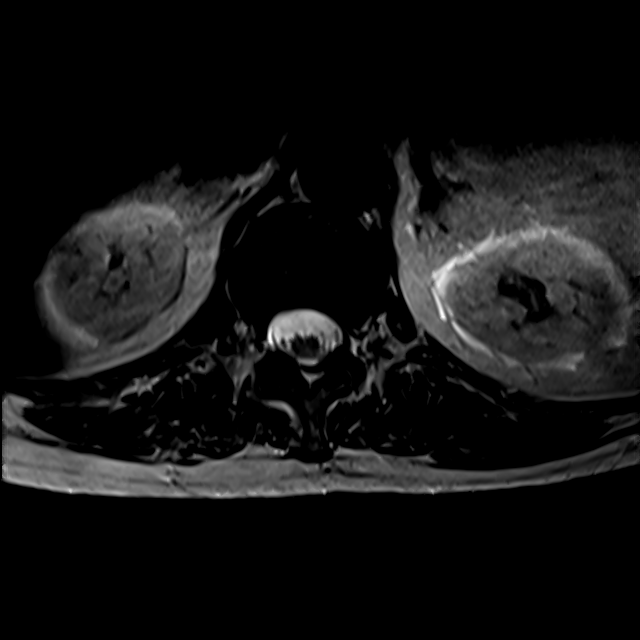
[im 4/40]
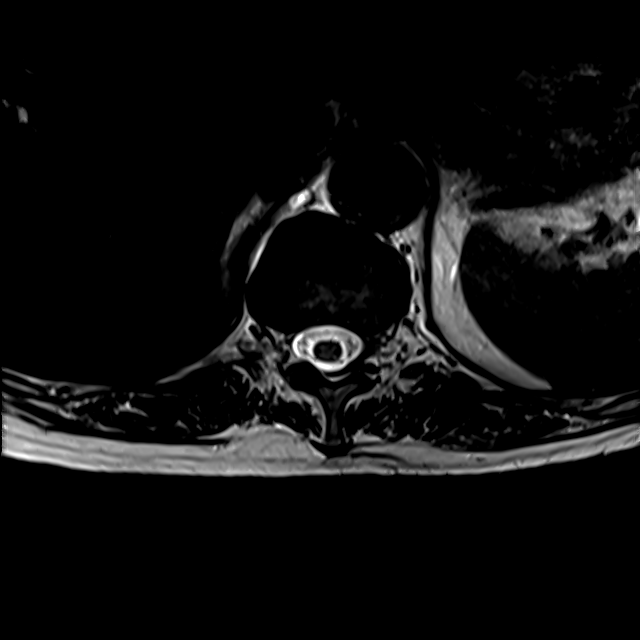
[im 8/40]
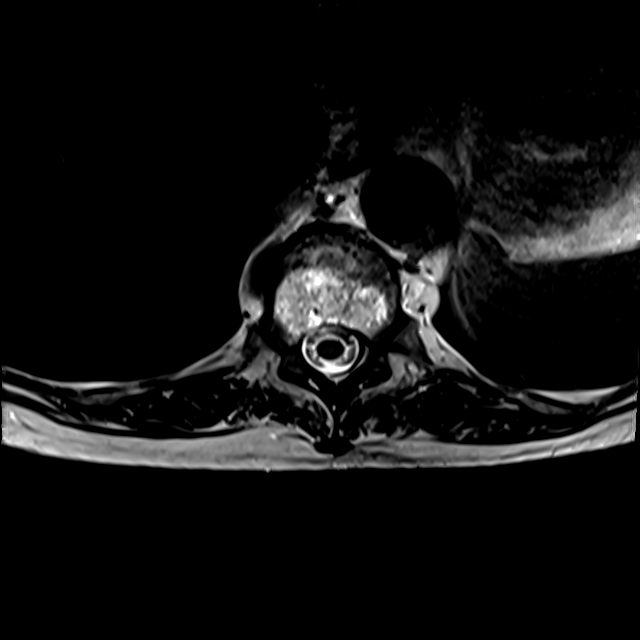
[im 12/40]
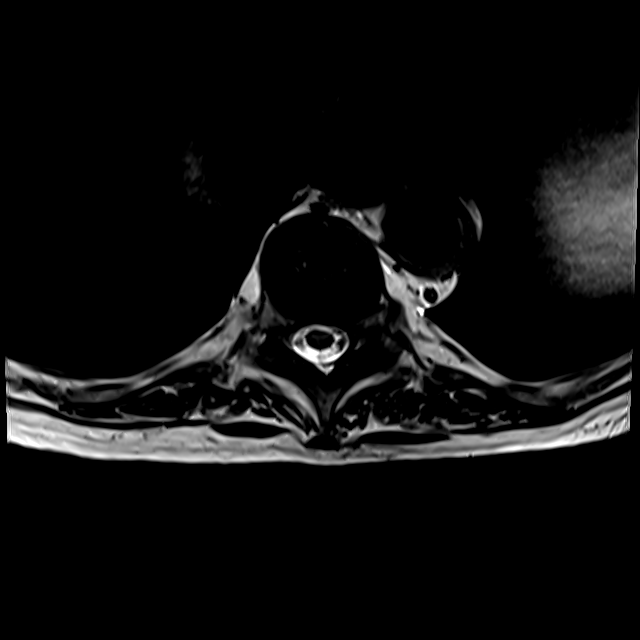
[im 16/40]
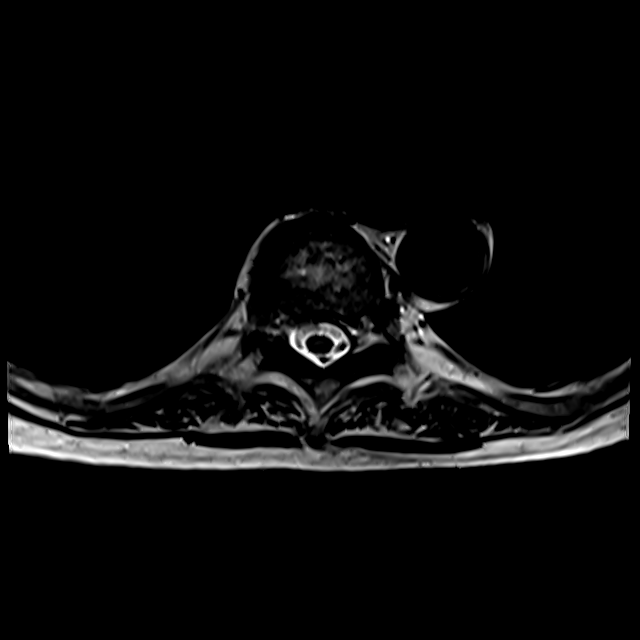
[im 20/40]
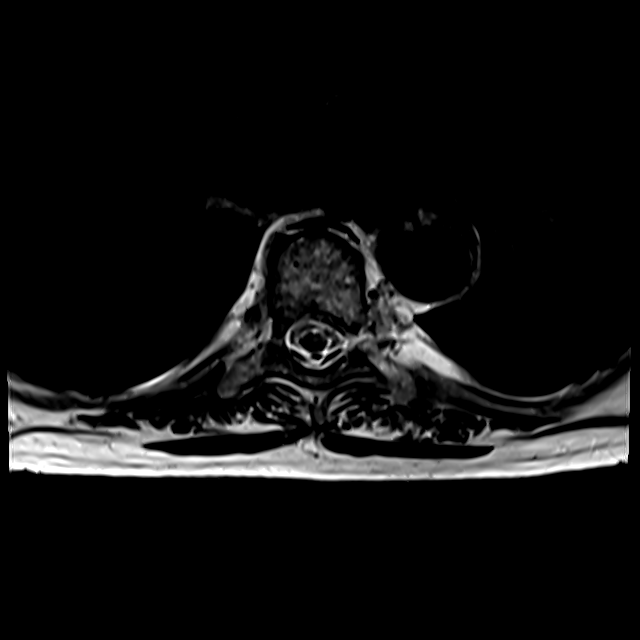
[im 24/40]
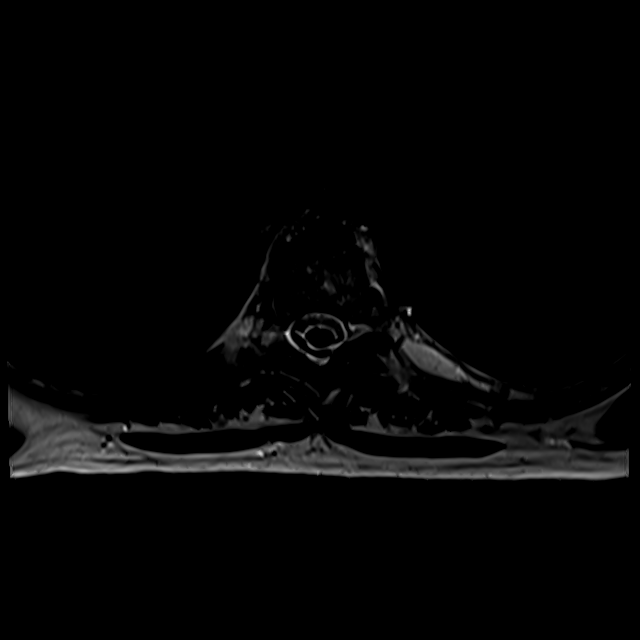
[im 28/40]
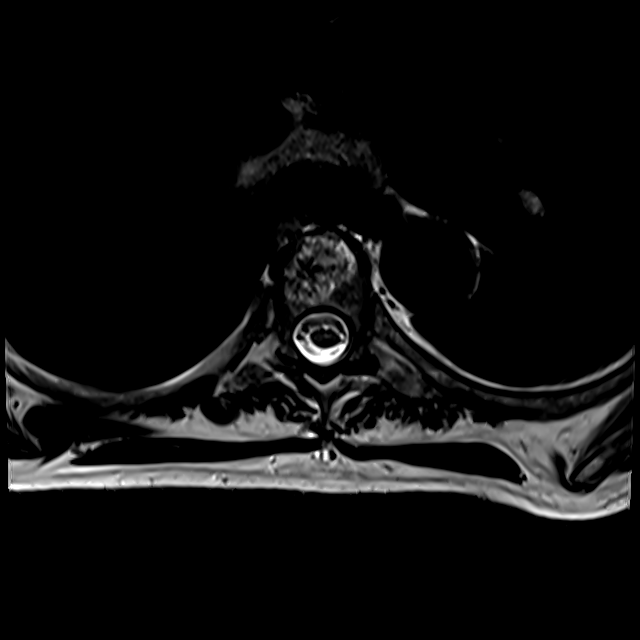
[im 32/40]
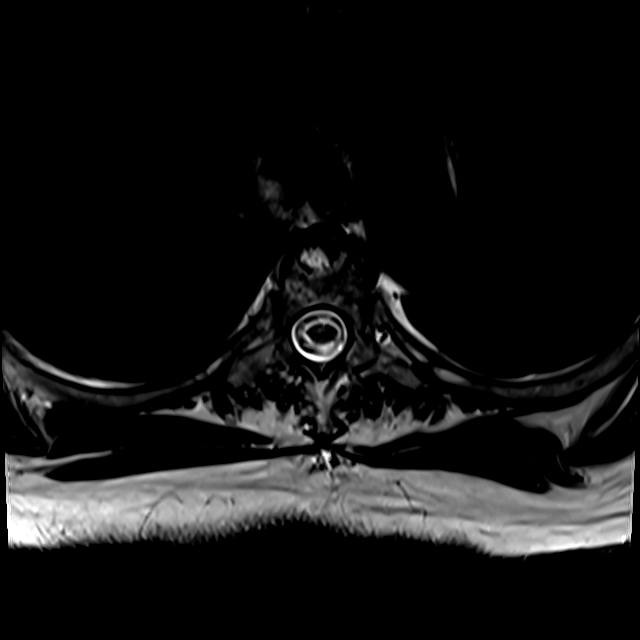
[im 36/40]
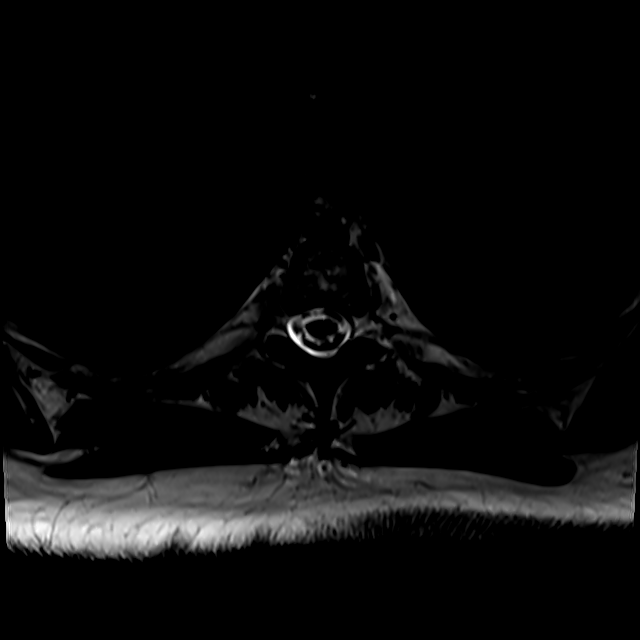

[20 of 48 positions shown; findings below may reference images not displayed]

FINDINGS: Alignment:  Physiologic

Vertebrae: Multiple hemangiomas, greatest at T5, T6 and T12. No
acute abnormality.

Cord:  Normal

Paraspinal and other soft tissues: Negative

Disc levels:

T8-9: Intermediate sized central disc protrusion without spinal
canal stenosis.

T9-10: Mild disc bulge and disc space narrowing without focal
herniation or stenosis.

T10-11: Disc space narrowing without herniation or stenosis.

Other disc spaces are normal.
IMPRESSION: Mild lower thoracic degenerative disc disease without spinal canal
or neural foraminal stenosis.
